# Patient Record
Sex: Female | Born: 1945
Health system: Southern US, Community
[De-identification: ages and names within clinical notes are randomized; demographics above are authoritative.]

## PROBLEM LIST (undated history)

## (undated) DIAGNOSIS — R87619 Unspecified abnormal cytological findings in specimens from cervix uteri: Secondary | ICD-10-CM

## (undated) DIAGNOSIS — T7840XA Allergy, unspecified, initial encounter: Secondary | ICD-10-CM

## (undated) DIAGNOSIS — E079 Disorder of thyroid, unspecified: Secondary | ICD-10-CM

## (undated) DIAGNOSIS — E785 Hyperlipidemia, unspecified: Secondary | ICD-10-CM

## (undated) DIAGNOSIS — M8580 Other specified disorders of bone density and structure, unspecified site: Secondary | ICD-10-CM

## (undated) DIAGNOSIS — K219 Gastro-esophageal reflux disease without esophagitis: Secondary | ICD-10-CM

## (undated) DIAGNOSIS — M1990 Unspecified osteoarthritis, unspecified site: Secondary | ICD-10-CM

## (undated) HISTORY — PX: OTHER SURGICAL HISTORY: SHX169

## (undated) HISTORY — DX: Unspecified osteoarthritis, unspecified site: M19.90

## (undated) HISTORY — PX: UPPER GASTROINTESTINAL ENDOSCOPY: SHX188

## (undated) HISTORY — DX: Other specified disorders of bone density and structure, unspecified site: M85.80

## (undated) HISTORY — DX: Allergy, unspecified, initial encounter: T78.40XA

## (undated) HISTORY — PX: POLYPECTOMY: SHX149

## (undated) HISTORY — DX: Unspecified abnormal cytological findings in specimens from cervix uteri: R87.619

## (undated) HISTORY — DX: Hyperlipidemia, unspecified: E78.5

## (undated) HISTORY — DX: Gastro-esophageal reflux disease without esophagitis: K21.9

## (undated) HISTORY — PX: TUBAL LIGATION: SHX77

## (undated) HISTORY — DX: Disorder of thyroid, unspecified: E07.9

---

## 1999-01-31 ENCOUNTER — Other Ambulatory Visit: Admission: RE | Admit: 1999-01-31 | Discharge: 1999-01-31 | Payer: Self-pay | Admitting: Obstetrics and Gynecology

## 1999-01-31 ENCOUNTER — Encounter: Payer: Self-pay | Admitting: Internal Medicine

## 1999-01-31 ENCOUNTER — Ambulatory Visit (HOSPITAL_COMMUNITY): Admission: RE | Admit: 1999-01-31 | Discharge: 1999-01-31 | Payer: Self-pay | Admitting: Internal Medicine

## 2000-03-01 ENCOUNTER — Ambulatory Visit (HOSPITAL_COMMUNITY): Admission: RE | Admit: 2000-03-01 | Discharge: 2000-03-01 | Payer: Self-pay | Admitting: Pediatrics

## 2000-03-01 ENCOUNTER — Encounter: Payer: Self-pay | Admitting: Obstetrics and Gynecology

## 2000-03-01 ENCOUNTER — Other Ambulatory Visit: Admission: RE | Admit: 2000-03-01 | Discharge: 2000-03-01 | Payer: Self-pay | Admitting: Obstetrics and Gynecology

## 2001-01-31 HISTORY — PX: SHOULDER ADHESION RELEASE: SHX773

## 2001-02-11 ENCOUNTER — Ambulatory Visit (HOSPITAL_BASED_OUTPATIENT_CLINIC_OR_DEPARTMENT_OTHER): Admission: RE | Admit: 2001-02-11 | Discharge: 2001-02-11 | Payer: Self-pay | Admitting: Orthopaedic Surgery

## 2001-09-17 ENCOUNTER — Other Ambulatory Visit: Admission: RE | Admit: 2001-09-17 | Discharge: 2001-09-17 | Payer: Self-pay | Admitting: Obstetrics and Gynecology

## 2001-09-17 ENCOUNTER — Encounter: Payer: Self-pay | Admitting: Obstetrics and Gynecology

## 2001-09-17 ENCOUNTER — Ambulatory Visit (HOSPITAL_COMMUNITY): Admission: RE | Admit: 2001-09-17 | Discharge: 2001-09-17 | Payer: Self-pay | Admitting: Obstetrics and Gynecology

## 2002-09-29 ENCOUNTER — Encounter: Payer: Self-pay | Admitting: Obstetrics and Gynecology

## 2002-09-29 ENCOUNTER — Ambulatory Visit (HOSPITAL_COMMUNITY): Admission: RE | Admit: 2002-09-29 | Discharge: 2002-09-29 | Payer: Self-pay | Admitting: Obstetrics and Gynecology

## 2002-09-29 ENCOUNTER — Other Ambulatory Visit: Admission: RE | Admit: 2002-09-29 | Discharge: 2002-09-29 | Payer: Self-pay | Admitting: Obstetrics and Gynecology

## 2003-10-27 ENCOUNTER — Other Ambulatory Visit: Admission: RE | Admit: 2003-10-27 | Discharge: 2003-10-27 | Payer: Self-pay | Admitting: Obstetrics and Gynecology

## 2003-10-27 ENCOUNTER — Ambulatory Visit (HOSPITAL_COMMUNITY): Admission: RE | Admit: 2003-10-27 | Discharge: 2003-10-27 | Payer: Self-pay | Admitting: Obstetrics and Gynecology

## 2003-10-29 ENCOUNTER — Encounter: Admission: RE | Admit: 2003-10-29 | Discharge: 2003-10-29 | Payer: Self-pay | Admitting: Obstetrics and Gynecology

## 2004-11-08 ENCOUNTER — Encounter: Admission: RE | Admit: 2004-11-08 | Discharge: 2004-11-08 | Payer: Self-pay | Admitting: Obstetrics and Gynecology

## 2004-11-14 ENCOUNTER — Encounter: Admission: RE | Admit: 2004-11-14 | Discharge: 2004-11-14 | Payer: Self-pay | Admitting: Obstetrics and Gynecology

## 2004-12-20 ENCOUNTER — Other Ambulatory Visit: Admission: RE | Admit: 2004-12-20 | Discharge: 2004-12-20 | Payer: Self-pay | Admitting: Obstetrics and Gynecology

## 2005-12-12 ENCOUNTER — Encounter: Admission: RE | Admit: 2005-12-12 | Discharge: 2005-12-12 | Payer: Self-pay | Admitting: Obstetrics and Gynecology

## 2006-02-12 ENCOUNTER — Other Ambulatory Visit: Admission: RE | Admit: 2006-02-12 | Discharge: 2006-02-12 | Payer: Self-pay | Admitting: Obstetrics and Gynecology

## 2007-02-11 ENCOUNTER — Ambulatory Visit (HOSPITAL_COMMUNITY): Admission: RE | Admit: 2007-02-11 | Discharge: 2007-02-11 | Payer: Self-pay | Admitting: Obstetrics and Gynecology

## 2007-03-12 ENCOUNTER — Other Ambulatory Visit: Admission: RE | Admit: 2007-03-12 | Discharge: 2007-03-12 | Payer: Self-pay | Admitting: Obstetrics and Gynecology

## 2008-03-11 ENCOUNTER — Ambulatory Visit (HOSPITAL_COMMUNITY): Admission: RE | Admit: 2008-03-11 | Discharge: 2008-03-11 | Payer: Self-pay | Admitting: Obstetrics and Gynecology

## 2008-03-19 ENCOUNTER — Other Ambulatory Visit: Admission: RE | Admit: 2008-03-19 | Discharge: 2008-03-19 | Payer: Self-pay | Admitting: Obstetrics and Gynecology

## 2008-04-15 ENCOUNTER — Ambulatory Visit (HOSPITAL_COMMUNITY): Admission: RE | Admit: 2008-04-15 | Discharge: 2008-04-15 | Payer: Self-pay | Admitting: Obstetrics and Gynecology

## 2009-04-11 ENCOUNTER — Ambulatory Visit (HOSPITAL_COMMUNITY): Admission: RE | Admit: 2009-04-11 | Discharge: 2009-04-11 | Payer: Self-pay | Admitting: Obstetrics and Gynecology

## 2010-06-01 ENCOUNTER — Other Ambulatory Visit: Payer: Self-pay | Admitting: Obstetrics and Gynecology

## 2010-06-01 ENCOUNTER — Encounter (INDEPENDENT_AMBULATORY_CARE_PROVIDER_SITE_OTHER): Payer: Self-pay | Admitting: *Deleted

## 2010-06-01 DIAGNOSIS — Z1231 Encounter for screening mammogram for malignant neoplasm of breast: Secondary | ICD-10-CM

## 2010-06-02 ENCOUNTER — Ambulatory Visit (HOSPITAL_COMMUNITY)
Admission: RE | Admit: 2010-06-02 | Discharge: 2010-06-02 | Disposition: A | Discharge status: Home / Self Care | Payer: Medicare Other | Source: Ambulatory Visit | Attending: Obstetrics and Gynecology | Admitting: Obstetrics and Gynecology

## 2010-06-02 DIAGNOSIS — Z1231 Encounter for screening mammogram for malignant neoplasm of breast: Secondary | ICD-10-CM

## 2010-06-08 NOTE — Letter (Signed)
Summary: Pre Visit Letter Revised  [facility]  [address] [place], [place] [address]   Phone: [phone]  Fax: [phone]        06/01/2010 MRN: [id number] Anne Morris [address] [place], [place]  [address]             Procedure Date:  07-13-10           Direct Colon---Dr. Russella Dar   Welcome to the Gastroenterology Division at [facility].    You are scheduled to see a nurse for your pre-procedure visit on 06-29-10 at 10:30a.m. on the 3rd floor at [facility], [address]  We ask that you try to arrive at our office 15 minutes prior to your appointment time to allow for check-in.  Please take a minute to review the attached form.  If you answer "Yes" to one or more of the questions on the first page, we ask that you call the person listed at your earliest opportunity.  If you answer "No" to all of the questions, please complete the rest of the form and bring it to your appointment.    Your nurse visit will consist of discussing your medical and surgical history, your immediate family medical history, and your medications.   If you are unable to list all of your medications on the form, please bring the medication bottles to your appointment and we will list them.  We will need to be aware of both prescribed and over the counter drugs.  We will need to know exact dosage information as well.    Please be prepared to read and sign documents such as consent forms, a financial agreement, and acknowledgement forms.  If necessary, and with your consent, a friend or relative is welcome to sit-in on the nurse visit with you.  Please bring your insurance card so that we may make a copy of it.  If your insurance requires a referral to see a specialist, please bring your referral form from your primary care physician.  No co-pay is required for this nurse visit.     If you cannot keep your appointment, please call [phone] to cancel or reschedule prior  to your appointment date.  This allows [place] the opportunity to schedule an appointment for another patient in need of care.    Thank you for choosing [facility] for your medical needs.  We appreciate the opportunity to care for you.  Please visit [place] at our website  to learn more about our practice.  Sincerely, The Gastroenterology Division

## 2010-06-29 ENCOUNTER — Ambulatory Visit (AMBULATORY_SURGERY_CENTER): Payer: Medicare Other | Admitting: *Deleted

## 2010-06-29 VITALS — Ht 61.5 in | Wt 130.0 lb

## 2010-06-29 DIAGNOSIS — Z86010 Personal history of colonic polyps: Secondary | ICD-10-CM

## 2010-06-29 MED ORDER — PEG-KCL-NACL-NASULF-NA ASC-C 100 G PO SOLR: 1.0000 | Freq: Once | ORAL | Status: AC

## 2010-07-12 ENCOUNTER — Encounter: Payer: Self-pay | Admitting: Gastroenterology

## 2010-07-13 ENCOUNTER — Encounter: Payer: Self-pay | Admitting: Gastroenterology

## 2010-07-13 ENCOUNTER — Ambulatory Visit (AMBULATORY_SURGERY_CENTER): Payer: Medicare Other | Admitting: Gastroenterology

## 2010-07-13 VITALS — BP 146/79 | HR 63 | Temp 97.7°F | Resp 16 | Ht 61.5 in | Wt 128.0 lb

## 2010-07-13 DIAGNOSIS — K5730 Diverticulosis of large intestine without perforation or abscess without bleeding: Secondary | ICD-10-CM

## 2010-07-13 DIAGNOSIS — Z860100 Personal history of colon polyps, unspecified: Secondary | ICD-10-CM | POA: Insufficient documentation

## 2010-07-13 DIAGNOSIS — Z1211 Encounter for screening for malignant neoplasm of colon: Secondary | ICD-10-CM

## 2010-07-13 HISTORY — PX: COLONOSCOPY: SHX174

## 2010-07-13 MED ORDER — SODIUM CHLORIDE 0.9 % IV SOLN: 500.0000 mL | INTRAVENOUS | Status: DC

## 2010-07-13 NOTE — Patient Instructions (Signed)
Discharge instructions reviewed  Diverticulosis and high fiber diet information given  Repeat colonoscopy in 10 years

## 2010-07-14 ENCOUNTER — Telehealth: Payer: Self-pay | Admitting: *Deleted

## 2010-07-14 NOTE — Telephone Encounter (Signed)

## 2010-08-18 NOTE — Op Note (Signed)
[place]. [facility]  Patient:    Anne Morris, Anne Morris Visit Number: [id number] MRN: [id number]          Service Type: DSU Location: [facility] Attending Physician:  Marcene Corning Dictated by:   Lubertha Basque. Jerl Santos, M.D. Proc. Date: 02/11/01 Admit Date:  02/11/2001                             Operative Report  PREOPERATIVE DIAGNOSES: 1. Left shoulder adhesive capsulitis. 2. Left shoulder impingement.  POSTOPERATIVE DIAGNOSES: 1. Left shoulder adhesive capsulitis. 2. Left shoulder impingement.  PROCEDURE: 1. Left shoulder manipulation and excision scar tissue. 2. Left shoulder acromioplasty. 3. Left shoulder distal clavicle excision.  ANESTHESIA:  General.  SURGEON:  Lubertha Basque. Jerl Santos, M.D.  ASSISTANT:  Prince Rome, P.A.  INDICATIONS FOR PROCEDURE:  The patient is a 65 year old woman with about six months of left shoulder difficulty.  This has persisted despite several intra-articular injections, and aggressive physical therapy.  She has undergone a MRI arthrogram which is consistent with an adhesive capsulitis situation with a small capsule, but also consistent with impingement with thinning of the rotator cuff, and a downsloping acromion.  At this point, she is offered operative intervention to consist of an arthroscopy.  The procedure was discussed with the patient, and informed operative consent was obtained after discussion of possible complications of reaction to anesthesia and infection.  DESCRIPTION OF PROCEDURE:  The patient was taken to the operating suite where general anesthetic was applied without difficulty.  She was positioned in beach chair position and prepped and draped in a normal sterile fashion. After the administration of preoperative IV antibiotics, and arthroscopy of the left shoulder was performed through a total of three portals. Glenohumeral joint showed no degenerative change, while the subscapularis was not visible due  to a thick band of scar tissue across the anterior capsule. This was excised with a shaver and a Bovie.  The biceps tendon was intact, and the rotator cuff appeared benign from below.  A manipulation was performed on the shoulder which took external rotation from 0 to 50 degrees, and forward flexion from 90 to 150.  The scope was reintroduced, and some scar tissue was excised.  The scope was then placed in the subacromial space.  An acromioplasty was performed with the burr in the lateral position, followed by a transfer of the burr to the posterior position.  There also appeared to be some impingement from the under surface of the [facility] joint, and this was co-planed, taking the distal clavicle, but not the [facility] joint formally.  The rotator cuff was examined from above, and some bursal tissue was removed, but no full thickness tear was seen.  The shoulder was thoroughly irrigated, followed by placement of Marcaine with epinephrine and morphine.  Simple sutures of nylon were used to reapproximate the portals, followed by Adaptic and dry gauze with tape.  Estimated blood loss and intraoperative fluids can be obtained from anesthesia records.  DISPOSITION:  The patient was extubated in the operating room and taken to the recovery room in stable condition.  Plans were for her to go home on the same day, and follow up in the office in less than a week.  She will resume therapy tomorrow. Dictated by:   Lubertha Basque Jerl Santos, M.D. Attending Physician:  Marcene Corning DD:  02/11/01 TD:  02/11/01 Job: [id number]

## 2010-08-18 NOTE — Op Note (Signed)
[place]. [facility]  Patient:    Anne Morris, Anne Morris Visit Number: [id number] MRN: [id number]          Service Type: DSU Location: [facility] Attending Physician:  Marcene Corning Dictated by:   Lubertha Basque. Jerl Santos, M.D. Proc. Date: 02/11/01 Admit Date:  02/11/2001                             Operative Report  PREOPERATIVE DIAGNOSIS: 1. Left shoulder adhesive capsulitis. 2. Left shoulder impingement.  POSTOPERATIVE DIAGNOSIS: 1. Left shoulder adhesive capsulitis. 2. Left shoulder impingement.  OPERATION PERFORMED: 1. Left shoulder manipulation and excision of scar tissue. 2. Left shoulder acromioplasty. 3. Left shoulder distal clavicle excision.  ANESTHESIA:  General.  ATTENDING SURGEON:  Lubertha Basque. Jerl Santos, M.D.  ASSISTANT:  Lindwood Qua, P.A.  INDICATIONS FOR PROCEDURE:  The patient is a 65 year old woman with about six months of left shoulder difficulty.  This has persisted despite several intra-articular injections and aggressive physical therapy.  The patient has undergone an MRI arthrogram which was consistent with an adhesive capsulitis situation with a small capsule but also consistent with impingement with thinning of the rotator cuff and a down-sloping acromion.  At this point she is offered operative intervention to consist of an arthroscopy.  The procedure was discussed with the patient and informed operative consent was obtained after discussion of possible complications of reaction to anesthesia and infection.  DESCRIPTION OF PROCEDURE:  The patient was taken to an operating suite where general anesthetic was applied without difficulty.  She was then positioned in beach chair position and prepped and draped in normal sterile fashion.  After administration of preop intravenous antibiotics, an arthroscopy of the left shoulder was performed through a total of three portals.  Glenohumeral joint showed no degenerative change while the  subscapularis was not visible due to a thick band of scar tissue across the anterior capsule.  This was excised with a shaver and a Bovie.  The biceps tendon was intact and the rotator cuff appeared benign from below.  Some manipulation was performed in the shoulder which took external rotation from 0 to 50 degrees and forward flexion from 90 to 150.  The scope was reintroduced and some scar tissue was excised.  The scope was then placed in the subacromial space.  An acromioplasty was performed with the bur in the lateral position followed by transfer of the bur to the posterior position.  There also appeared to be be some impingement from the undersurface of the acromioclavicular joint and this was co-planed taking the distal clavicle but not the acromioclavicular joint formally.  The rotator cuff was examined from above and some bursal tissue was removed but no full thickness tear was seen.  The shoulder was thoroughly irrigated followed by placement of Marcaine with epinephrine and morphine.  Simple sutures of nylon were used to reapproximate the portals followed by Adaptic and dry gauze with tape.  Estimated blood loss and intraoperative fluids can be obtained from Anesthesia records.  DISPOSITION:  The patient was extubated in the operating room and taken to the recovery room in stable condition.  Plans were for her to go home the same day and to follow up in the office in less than a week.  She will resume therapy tomorrow. Dictated by:   Lubertha Basque Jerl Santos, M.D. Attending Physician:  Marcene Corning DD:  02/11/01 TD:  02/11/01 Job: [id number]

## 2011-07-03 ENCOUNTER — Other Ambulatory Visit: Payer: Self-pay | Admitting: Obstetrics and Gynecology

## 2011-07-03 DIAGNOSIS — Z1231 Encounter for screening mammogram for malignant neoplasm of breast: Secondary | ICD-10-CM

## 2011-07-04 ENCOUNTER — Ambulatory Visit (HOSPITAL_COMMUNITY)
Admission: RE | Admit: 2011-07-04 | Discharge: 2011-07-04 | Disposition: A | Discharge status: Home / Self Care | Payer: Medicare Other | Source: Ambulatory Visit | Attending: Obstetrics and Gynecology | Admitting: Obstetrics and Gynecology

## 2011-07-04 DIAGNOSIS — Z1231 Encounter for screening mammogram for malignant neoplasm of breast: Secondary | ICD-10-CM

## 2012-06-11 ENCOUNTER — Other Ambulatory Visit: Payer: Self-pay | Admitting: Obstetrics and Gynecology

## 2012-06-11 DIAGNOSIS — Z1231 Encounter for screening mammogram for malignant neoplasm of breast: Secondary | ICD-10-CM

## 2012-07-04 ENCOUNTER — Ambulatory Visit (HOSPITAL_COMMUNITY): Payer: Medicare Other

## 2012-07-07 ENCOUNTER — Ambulatory Visit (HOSPITAL_COMMUNITY)
Admission: RE | Admit: 2012-07-07 | Discharge: 2012-07-07 | Disposition: A | Discharge status: Home / Self Care | Payer: Medicare Other | Source: Ambulatory Visit | Attending: Obstetrics and Gynecology | Admitting: Obstetrics and Gynecology

## 2012-07-07 DIAGNOSIS — Z1231 Encounter for screening mammogram for malignant neoplasm of breast: Secondary | ICD-10-CM

## 2013-01-21 ENCOUNTER — Telehealth: Payer: Self-pay | Admitting: Family Medicine

## 2013-01-26 ENCOUNTER — Other Ambulatory Visit: Payer: Self-pay

## 2013-01-26 NOTE — Telephone Encounter (Signed)
Last thyroid level 06/03/12  Last seen 06/03/12  [facility]    If approved print for mail order and route to nurse

## 2013-01-27 MED ORDER — LEVOTHYROXINE SODIUM 50 MCG PO TABS: 50.0000 ug | ORAL_TABLET | Freq: Every day | ORAL | Status: DC

## 2013-01-27 NOTE — Telephone Encounter (Signed)
Patient needs to be seen. Has exceeded time since last visit. Limited quantity refilled. Needs to bring all medications to next appointment.   

## 2013-01-27 NOTE — Telephone Encounter (Signed)
Left message for pt that rx for levothyroxine is ready for pick up at front desk.

## 2013-05-04 ENCOUNTER — Encounter (INDEPENDENT_AMBULATORY_CARE_PROVIDER_SITE_OTHER): Payer: Self-pay

## 2013-05-04 ENCOUNTER — Ambulatory Visit (INDEPENDENT_AMBULATORY_CARE_PROVIDER_SITE_OTHER): Payer: Medicare HMO | Admitting: Family Medicine

## 2013-05-04 ENCOUNTER — Encounter: Payer: Self-pay | Admitting: Family Medicine

## 2013-05-04 VITALS — BP 115/60 | HR 60 | Temp 97.8°F | Ht 61.5 in | Wt 145.0 lb

## 2013-05-04 DIAGNOSIS — E782 Mixed hyperlipidemia: Secondary | ICD-10-CM | POA: Insufficient documentation

## 2013-05-04 DIAGNOSIS — E039 Hypothyroidism, unspecified: Secondary | ICD-10-CM | POA: Insufficient documentation

## 2013-05-04 DIAGNOSIS — E785 Hyperlipidemia, unspecified: Secondary | ICD-10-CM

## 2013-05-04 DIAGNOSIS — Z23 Encounter for immunization: Secondary | ICD-10-CM

## 2013-05-04 DIAGNOSIS — E559 Vitamin D deficiency, unspecified: Secondary | ICD-10-CM | POA: Insufficient documentation

## 2013-05-04 DIAGNOSIS — Z0000 Encounter for general adult medical examination without abnormal findings: Secondary | ICD-10-CM

## 2013-05-04 LAB — POCT CBC
GRANULOCYTE PERCENT: 69.4 % (ref 37–80)
HCT, POC: 43.3 % (ref 37.7–47.9)
Hemoglobin: 14 g/dL (ref 12.2–16.2)
LYMPH, POC: 2.6 (ref 0.6–3.4)
MCH, POC: 28.2 pg (ref 27–31.2)
MCHC: 32.2 g/dL (ref 31.8–35.4)
MCV: 87.6 fL (ref 80–97)
MPV: 9.2 fL (ref 0–99.8)
PLATELET COUNT, POC: 260 10*3/uL (ref 142–424)
POC Granulocyte: 6.5 (ref 2–6.9)
POC LYMPH PERCENT: 27.8 %L (ref 10–50)
RBC: 5 M/uL (ref 4.04–5.48)
RDW, POC: 13.5 %
WBC: 9.3 10*3/uL (ref 4.6–10.2)

## 2013-05-04 NOTE — Progress Notes (Addendum)
Subjective:    Patient ID: Anne Morris, female    DOB: 20-Sep-1945, 68 y.o.   MRN: [id number]  HPI Pt here for follow up and management of chronic medical problems. Patient comes in today for her yearly exam. She also goes to see the gynecologist. She will be given FOBT, have lab work drawn, and we'll consider getting a Prevnar vaccine and 18. We are currently checking on the date of her last DEXA scan. She will check with her insurance regarding the administration of a Zostavax vaccine.        Patient Active Problem List   Diagnosis Date Noted  . Hypothyroidism 05/04/2013  . Hyperlipidemia 05/04/2013  . Personal  07/13/2010  . Special screening for malignant neoplasms, colon 07/13/2010   Outpatient Encounter Prescriptions as of 05/04/2013  Medication Sig  . Calcium-Vitamin D (CALTRATE 600 PLUS-VIT D PO) Take by mouth.    . cholecalciferol (VITAMIN D) 1000 UNITS tablet Take 2,000 Units by mouth daily.  . Coenzyme Q10 200 MG capsule Take 200 mg by mouth daily.  Marland Kitchen levothyroxine (SYNTHROID, LEVOTHROID) 50 MCG tablet Take 1 tablet (50 mcg total) by mouth daily.  . [DISCONTINUED] fish oil-omega-3 fatty acids 1000 MG capsule Take 1,200 g by mouth daily.    . [DISCONTINUED] fluticasone (FLONASE) 50 MCG/ACT nasal spray 1 spray by Nasal route daily.    . [DISCONTINUED] Vitamin D, Ergocalciferol, (DRISDOL) 50000 UNITS CAPS Take 50,000 Units by mouth every 7 (seven) days.      Review of Systems  Constitutional: Negative.   HENT: Negative.   Eyes: Negative.   Respiratory: Negative.   Cardiovascular: Negative.   Gastrointestinal: Negative.   Endocrine: Negative.   Genitourinary: Negative.   Musculoskeletal: Negative.   Skin: Negative.   Allergic/Immunologic: Negative.   Neurological: Negative.   Hematological: Negative.   Psychiatric/Behavioral: Negative.        Objective:   Physical Exam  Nursing note and vitals reviewed. Constitutional: She is oriented to person, place, and  time. She appears well-developed and well-nourished. No distress.  Pleasant and younger appearing than her stated age  HENT:  Head: Normocephalic and atraumatic.  Right Ear: External ear normal.  Left Ear: External ear normal.  Nose: Nose normal.  Mouth/Throat: Oropharynx is clear and moist.  Eyes: Conjunctivae and EOM are normal. Pupils are equal, round, and reactive to light. Right eye exhibits no discharge. Left eye exhibits no discharge. No scleral icterus.  Neck: Normal range of motion. Neck supple. No JVD present. No thyromegaly present.  No carotid bruits  Cardiovascular: Normal rate, regular rhythm, normal heart sounds and intact distal pulses.  Exam reveals no gallop and no friction rub.   No murmur heard. At 72 per minute  Pulmonary/Chest: Effort normal and breath sounds normal. No respiratory distress. She has no wheezes. She has no rales. She exhibits no tenderness.  No axillary nodes  Abdominal: Soft. Bowel sounds are normal. She exhibits no mass. There is no tenderness. There is no rebound and no guarding.  Musculoskeletal: Normal range of motion. She exhibits no edema and no tenderness.  Lymphadenopathy:    She has no cervical adenopathy.  Neurological: She is alert and oriented to person, place, and time. She has normal reflexes. No cranial nerve deficit.  Skin: Skin is warm and dry. No rash noted.  Psychiatric: She has a normal mood and affect. Her behavior is normal. Judgment and thought content normal.   BP 115/60  Pulse 60  Temp(Src) 97.8 F (36.6 C) (  Oral)  Ht 5' 1.5" (1.562 m)  Wt 145 lb (65.772 kg)  BMI 26.96 kg/m2  [facility] reading (PRIMARY) by  Dr. Brunilda Payor x-ray-patient did not get chest x-ray today                                         Assessment & Plan:      1. Hypothyroidism - POCT CBC - Thyroid Panel With TSH  2. Hyperlipidemia - POCT CBC - BMP8+EGFR - Hepatic function panel - NMR, lipoprofile  3. Vitamin D deficiency - Vit D  25  hydroxy (rtn osteoporosis monitoring)  4. Health care maintenance  Meds ordered this encounter  Medications  . cholecalciferol (VITAMIN D) 1000 UNITS tablet    Sig: Take 2,000 Units by mouth daily.  . Coenzyme Q10 200 MG capsule    Sig: Take 200 mg by mouth daily.     Patient Instructions  Continue current medications. Continue good therapeutic lifestyle changes which include good diet and exercise. Fall precautions discussed with patient. Schedule your flu vaccine if you haven't had it yet If you are over 36 years old - you may need Prevnar 72 or the adult Pneumonia vaccine. We will call you with the results of the lab work once those results are available. Please sign up for my chart Return the FOBT You are due a Prevnar, a Zostavax and T-dap   Arrie Senate MD

## 2013-05-04 NOTE — Patient Instructions (Addendum)
Continue current medications. Continue good therapeutic lifestyle changes which include good diet and exercise. Fall precautions discussed with patient. Schedule your flu vaccine if you haven't had it yet If you are over 68 years old - you may need Prevnar 73 or the adult Pneumonia vaccine. We will call you with the results of the lab work once those results are available. Please sign up for my chart Return the FOBT You are due a Prevnar, a Zostavax and T-dap

## 2013-05-04 NOTE — Addendum Note (Signed)
Addended by: Zannie Cove on: 05/04/2013 12:24 PM   Modules accepted: Orders

## 2013-05-06 LAB — NMR, LIPOPROFILE
Cholesterol: 191 mg/dL (ref <200)
HDL CHOLESTEROL BY NMR: 56 mg/dL (ref >=40)
HDL PARTICLE NUMBER: 34.3 umol/L (ref >=30.5)
LDL Particle Number: 1630 nmol/L — ABNORMAL HIGH (ref <1000)
LDL Size: 21.2 nm (ref >20.5)
LDLC SERPL CALC-MCNC: 111 mg/dL — ABNORMAL HIGH (ref <100)
LP-IR SCORE: 45 (ref <=45)
Small LDL Particle Number: 398 nmol/L (ref <=527)
Triglycerides by NMR: 119 mg/dL (ref <150)

## 2013-05-06 LAB — HEPATIC FUNCTION PANEL
ALK PHOS: 111 IU/L (ref 39–117)
ALT: 31 IU/L (ref 0–32)
AST: 30 IU/L (ref 0–40)
Albumin: 4.3 g/dL (ref 3.6–4.8)
Bilirubin, Direct: 0.11 mg/dL (ref 0.00–0.40)
Total Bilirubin: 0.5 mg/dL (ref 0.0–1.2)
Total Protein: 6.2 g/dL (ref 6.0–8.5)

## 2013-05-06 LAB — VITAMIN D 25 HYDROXY (VIT D DEFICIENCY, FRACTURES): VIT D 25 HYDROXY: 30.9 ng/mL (ref 30.0–100.0)

## 2013-05-06 LAB — BMP8+EGFR
BUN / CREAT RATIO: 17 (ref 11–26)
BUN: 15 mg/dL (ref 8–27)
CHLORIDE: 104 mmol/L (ref 97–108)
CO2: 24 mmol/L (ref 18–29)
Calcium: 9.4 mg/dL (ref 8.7–10.3)
Creatinine, Ser: 0.86 mg/dL (ref 0.57–1.00)
GFR calc non Af Amer: 70 mL/min/{1.73_m2} (ref >59)
GFR, EST AFRICAN AMERICAN: 80 mL/min/{1.73_m2} (ref >59)
Glucose: 94 mg/dL (ref 65–99)
Potassium: 5.3 mmol/L — ABNORMAL HIGH (ref 3.5–5.2)
SODIUM: 142 mmol/L (ref 134–144)

## 2013-05-06 LAB — THYROID PANEL WITH TSH
FREE THYROXINE INDEX: 2.5 (ref 1.2–4.9)
T3 Uptake Ratio: 27 % (ref 24–39)
T4, Total: 9.1 ug/dL (ref 4.5–12.0)
TSH: 1.43 u[IU]/mL (ref 0.450–4.500)

## 2013-05-07 ENCOUNTER — Encounter: Payer: Self-pay | Admitting: Family Medicine

## 2013-05-07 ENCOUNTER — Telehealth: Payer: Self-pay | Admitting: *Deleted

## 2013-05-07 NOTE — Telephone Encounter (Signed)
Aware of results. She will return in 2 weeks to recheck potassium. She is aware of need to schedule with pharmacist to discuss diet.

## 2013-05-07 NOTE — Telephone Encounter (Signed)
Message copied by Shelbie Ammons on Thu May 07, 2013  2:58 PM ------      Message from: Chipper Herb      Created: Wed May 06, 2013  7:33 AM       Blood sugar, kidney function test and electrolytes are within normal limits except the potassium is slightly elevated.------- repeat potassium or BMP in 2 weeks nonfasting      Liver function tests are within normal limits      The total LDL particle number is elevated at 1630 this should be less than 1000. This is by advanced lipid testing. The LDL C. is elevated at 111. The triglycerides are good. The good cholesterol is good.----- please schedule the patient with an appointment with the clinical pharmacist to help her more with a diet. She would prefer not to take any medication.      The vitamin D level is at the low end of the normal range at 30.9. She should increase her vitamin D by 1000 daily she can buy vitamin D3 over-the-counter.      All thyroid function test are within normal limits ------

## 2013-05-11 ENCOUNTER — Other Ambulatory Visit: Payer: Self-pay | Admitting: Family Medicine

## 2013-05-11 DIAGNOSIS — E039 Hypothyroidism, unspecified: Secondary | ICD-10-CM

## 2013-05-11 MED ORDER — LEVOTHYROXINE SODIUM 50 MCG PO TABS: 50.0000 ug | ORAL_TABLET | Freq: Every day | ORAL | Status: DC

## 2013-05-16 ENCOUNTER — Encounter: Payer: Self-pay | Admitting: Family Medicine

## 2013-05-21 ENCOUNTER — Other Ambulatory Visit (INDEPENDENT_AMBULATORY_CARE_PROVIDER_SITE_OTHER): Payer: Medicare HMO

## 2013-05-21 DIAGNOSIS — R7989 Other specified abnormal findings of blood chemistry: Secondary | ICD-10-CM

## 2013-05-22 ENCOUNTER — Encounter: Payer: Self-pay | Admitting: Family Medicine

## 2013-05-22 LAB — BMP8+EGFR
BUN/Creatinine Ratio: 20 (ref 11–26)
BUN: 17 mg/dL (ref 8–27)
CALCIUM: 9.3 mg/dL (ref 8.7–10.3)
CO2: 24 mmol/L (ref 18–29)
CREATININE: 0.86 mg/dL (ref 0.57–1.00)
Chloride: 103 mmol/L (ref 97–108)
GFR calc Af Amer: 80 mL/min/{1.73_m2} (ref >59)
GFR, EST NON AFRICAN AMERICAN: 70 mL/min/{1.73_m2} (ref >59)
GLUCOSE: 96 mg/dL (ref 65–99)
Potassium: 4.6 mmol/L (ref 3.5–5.2)
Sodium: 142 mmol/L (ref 134–144)

## 2013-05-26 MED ORDER — LEVOTHYROXINE SODIUM 50 MCG PO TABS: 50.0000 ug | ORAL_TABLET | Freq: Every day | ORAL | Status: DC

## 2013-06-30 ENCOUNTER — Other Ambulatory Visit: Payer: Medicare HMO

## 2013-06-30 DIAGNOSIS — Z1212 Encounter for screening for malignant neoplasm of rectum: Secondary | ICD-10-CM

## 2013-06-30 NOTE — Progress Notes (Signed)
Patient dropped off fobt 

## 2013-07-01 LAB — FECAL OCCULT BLOOD, IMMUNOCHEMICAL: Fecal Occult Bld: NEGATIVE

## 2013-07-08 NOTE — Progress Notes (Signed)
Quick Note:  Copy of labs sent to patient ______ 

## 2013-12-14 ENCOUNTER — Other Ambulatory Visit: Payer: Self-pay | Admitting: *Deleted

## 2013-12-14 DIAGNOSIS — Z85828 Personal history of other malignant neoplasm of skin: Secondary | ICD-10-CM

## 2014-06-01 LAB — HM MAMMOGRAPHY

## 2014-06-02 ENCOUNTER — Other Ambulatory Visit: Payer: Self-pay | Admitting: Nurse Practitioner

## 2014-06-02 DIAGNOSIS — Z1231 Encounter for screening mammogram for malignant neoplasm of breast: Secondary | ICD-10-CM

## 2014-06-07 ENCOUNTER — Encounter: Payer: Self-pay | Admitting: Family Medicine

## 2014-06-08 ENCOUNTER — Other Ambulatory Visit (INDEPENDENT_AMBULATORY_CARE_PROVIDER_SITE_OTHER): Payer: Medicare HMO

## 2014-06-08 DIAGNOSIS — E785 Hyperlipidemia, unspecified: Secondary | ICD-10-CM

## 2014-06-08 DIAGNOSIS — E039 Hypothyroidism, unspecified: Secondary | ICD-10-CM

## 2014-06-08 DIAGNOSIS — E559 Vitamin D deficiency, unspecified: Secondary | ICD-10-CM | POA: Diagnosis not present

## 2014-06-08 LAB — POCT CBC
Granulocyte percent: 57.7 %G (ref 37–80)
HEMATOCRIT: 44.6 % (ref 37.7–47.9)
HEMOGLOBIN: 14.2 g/dL (ref 12.2–16.2)
Lymph, poc: 2 (ref 0.6–3.4)
MCH: 28 pg (ref 27–31.2)
MCHC: 31.9 g/dL (ref 31.8–35.4)
MCV: 87.7 fL (ref 80–97)
MPV: 9.9 fL (ref 0–99.8)
POC Granulocyte: 2.9 (ref 2–6.9)
POC LYMPH %: 38.5 % (ref 10–50)
Platelet Count, POC: 269 10*3/uL (ref 142–424)
RBC: 5.08 M/uL (ref 4.04–5.48)
RDW, POC: 13.1 %
WBC: 5.1 10*3/uL (ref 4.6–10.2)

## 2014-06-08 NOTE — Progress Notes (Signed)
Lab only 

## 2014-06-09 LAB — HEPATIC FUNCTION PANEL
ALT: 18 IU/L (ref 0–32)
AST: 22 IU/L (ref 0–40)
Albumin: 4.3 g/dL (ref 3.6–4.8)
Alkaline Phosphatase: 102 IU/L (ref 39–117)
BILIRUBIN, DIRECT: 0.1 mg/dL (ref 0.00–0.40)
Bilirubin Total: 0.4 mg/dL (ref 0.0–1.2)
Total Protein: 6.6 g/dL (ref 6.0–8.5)

## 2014-06-09 LAB — BMP8+EGFR
BUN/Creatinine Ratio: 15 (ref 11–26)
BUN: 13 mg/dL (ref 8–27)
CHLORIDE: 105 mmol/L (ref 97–108)
CO2: 20 mmol/L (ref 18–29)
Calcium: 9.5 mg/dL (ref 8.7–10.3)
Creatinine, Ser: 0.85 mg/dL (ref 0.57–1.00)
GFR calc Af Amer: 81 mL/min/{1.73_m2} (ref >59)
GFR, EST NON AFRICAN AMERICAN: 70 mL/min/{1.73_m2} (ref >59)
Glucose: 93 mg/dL (ref 65–99)
Potassium: 4.7 mmol/L (ref 3.5–5.2)
SODIUM: 143 mmol/L (ref 134–144)

## 2014-06-09 LAB — NMR, LIPOPROFILE
Cholesterol: 178 mg/dL (ref 100–199)
HDL Cholesterol by NMR: 52 mg/dL (ref >39)
HDL PARTICLE NUMBER: 31.5 umol/L (ref >=30.5)
LDL Particle Number: 1340 nmol/L — ABNORMAL HIGH (ref <1000)
LDL SIZE: 21.1 nm (ref >20.5)
LDL-C: 103 mg/dL — ABNORMAL HIGH (ref 0–99)
LP-IR Score: 44 (ref <=45)
Small LDL Particle Number: 552 nmol/L — ABNORMAL HIGH (ref <=527)
Triglycerides by NMR: 114 mg/dL (ref 0–149)

## 2014-06-09 LAB — THYROID PANEL WITH TSH
Free Thyroxine Index: 2.9 (ref 1.2–4.9)
T3 Uptake Ratio: 26 % (ref 24–39)
T4, Total: 11 ug/dL (ref 4.5–12.0)
TSH: 1.34 u[IU]/mL (ref 0.450–4.500)

## 2014-06-09 LAB — VITAMIN D 25 HYDROXY (VIT D DEFICIENCY, FRACTURES): Vit D, 25-Hydroxy: 31.2 ng/mL (ref 30.0–100.0)

## 2014-06-14 ENCOUNTER — Encounter: Payer: Self-pay | Admitting: Family Medicine

## 2014-06-14 ENCOUNTER — Ambulatory Visit (INDEPENDENT_AMBULATORY_CARE_PROVIDER_SITE_OTHER): Payer: Medicare HMO | Admitting: Family Medicine

## 2014-06-14 ENCOUNTER — Ambulatory Visit (INDEPENDENT_AMBULATORY_CARE_PROVIDER_SITE_OTHER): Payer: Medicare HMO

## 2014-06-14 VITALS — BP 137/65 | HR 67 | Temp 97.3°F | Ht 61.5 in | Wt 145.0 lb

## 2014-06-14 DIAGNOSIS — R300 Dysuria: Secondary | ICD-10-CM | POA: Diagnosis not present

## 2014-06-14 DIAGNOSIS — E785 Hyperlipidemia, unspecified: Secondary | ICD-10-CM

## 2014-06-14 DIAGNOSIS — E039 Hypothyroidism, unspecified: Secondary | ICD-10-CM

## 2014-06-14 DIAGNOSIS — E559 Vitamin D deficiency, unspecified: Secondary | ICD-10-CM

## 2014-06-14 DIAGNOSIS — Z0000 Encounter for general adult medical examination without abnormal findings: Secondary | ICD-10-CM

## 2014-06-14 LAB — POCT URINALYSIS DIPSTICK
Bilirubin, UA: NEGATIVE
Glucose, UA: NEGATIVE
Ketones, UA: NEGATIVE
Nitrite, UA: NEGATIVE
UROBILINOGEN UA: NEGATIVE
pH, UA: 5

## 2014-06-14 LAB — POCT UA - MICROSCOPIC ONLY
Casts, Ur, LPF, POC: NEGATIVE
Crystals, Ur, HPF, POC: NEGATIVE
Mucus, UA: NEGATIVE
Yeast, UA: NEGATIVE

## 2014-06-14 MED ORDER — LEVOTHYROXINE SODIUM 50 MCG PO TABS: 50.0000 ug | ORAL_TABLET | Freq: Every day | ORAL | Status: DC

## 2014-06-14 NOTE — Patient Instructions (Addendum)
Medicare Annual Wellness Visit  [place] and the medical providers at [facility] strive to bring you the best medical care.  In doing so we not only want to address your current medical conditions and concerns but also to detect new conditions early and prevent illness, disease and health-related problems.    Medicare offers a yearly Wellness Visit which allows our clinical staff to assess your need for preventative services including immunizations, lifestyle education, counseling to decrease risk of preventable diseases and screening for fall risk and other medical concerns.    This visit is provided free of charge (no copay) for all Medicare recipients. The clinical pharmacists at [facility] have begun to conduct these Wellness Visits which will also include a thorough review of all your medications.    As you primary medical provider recommend that you make an appointment for your Annual Wellness Visit if you have not done so already this year.  You may set up this appointment before you leave today or you may call back ([phone]) and schedule an appointment.  Please make sure when you call that you mention that you are scheduling your Annual Wellness Visit with the clinical pharmacist so that the appointment may be made for the proper length of time.     Continue current medications. Continue good therapeutic lifestyle changes which include good diet and exercise. Fall precautions discussed with patient. If an FOBT was given today- please return it to our front desk. If you are over 6 years old - you may need Prevnar 42 or the adult Pneumonia vaccine.  Flu Shots are still available at our office. If you still haven't had one please call to set up a nurse visit to get one.   After your visit with [place] today you will receive a survey in the mail or online from [employer] regarding your care with [place]. Please take a moment to  fill this out. Your feedback is very important to [place] as you can help [place] better understand your patient needs as well as improve your experience and satisfaction. WE CARE ABOUT YOU!!!   The patient should make a special effort to drink more water She should exercise regularly and watch her diet for carbohydrates Watch the fried greasy and highly spiced foods Have less dairy products like ice cream etc. We will call you with the results of the urinalysis and a chest x-ray if you get one today. Please return the FOBT Please use Debrox for the ears 2-3 drops nightly for 3 nights wait 1 week and repeat this. If the cerumen doesn't seem to soften and come out please call the nurse and she will irrigate this on each side for you. You can purchase the vitamin D3, 1000, the now brand at the drugstore and take one daily indefinitely

## 2014-06-14 NOTE — Addendum Note (Signed)
Addended by: Pollyann Kennedy F on: 06/14/2014 05:29 PM   Modules accepted: Orders

## 2014-06-14 NOTE — Progress Notes (Signed)
Subjective:    Patient ID: Anne Morris, female    DOB: [birth year], 69 y.o.   MRN: [id number]  HPI Pt here for follow up and management of chronic medical problems which includes hypothyroid. She is taking medications regularly. The patient is pleasant and looks younger than her stated age of 69 years. There is a family history of breast cancer she gets her mammogram yearly. The patient denies chest pain, trouble swallowing or any GI symptoms. She also denies any voiding problems. She's been taking her thyroid medicine regularly but has not taken vitamin D in a good while. She has no specific complaints. Her recent lab work was reviewed with her in as a result of this we're going to have her start back on her vitamin D3 1000  1 daily area       Patient Active Problem List   Diagnosis Date Noted  . Hypothyroidism 05/04/2013  . Hyperlipidemia 05/04/2013  . Vitamin D deficiency 05/04/2013  . Personal  07/13/2010  . Special screening for malignant neoplasms, colon 07/13/2010   Outpatient Encounter Prescriptions as of 06/14/2014  Medication Sig  . levothyroxine (SYNTHROID, LEVOTHROID) 50 MCG tablet Take 1 tablet (50 mcg total) by mouth daily.  . [DISCONTINUED] Calcium-Vitamin D (CALTRATE 600 PLUS-VIT D PO) Take by mouth.    . [DISCONTINUED] cholecalciferol (VITAMIN D) 1000 UNITS tablet Take 2,000 Units by mouth daily.  . [DISCONTINUED] Coenzyme Q10 200 MG capsule Take 200 mg by mouth daily.    Review of Systems  Constitutional: Negative.   HENT: Negative.   Eyes: Negative.   Respiratory: Negative.   Cardiovascular: Negative.   Gastrointestinal: Negative.   Endocrine: Negative.   Genitourinary: Negative.   Musculoskeletal: Negative.   Skin: Negative.   Allergic/Immunologic: Negative.   Neurological: Negative.   Hematological: Negative.   Psychiatric/Behavioral: Negative.        Objective:   Physical Exam  Constitutional: She is oriented to person, place, and time. She  appears well-developed and well-nourished. No distress.  A shunt is pleasant and alert.  HENT:  Head: Normocephalic and atraumatic.  Right Ear: External ear normal.  Left Ear: External ear normal.  Nose: Nose normal.  Mouth/Throat: Oropharynx is clear and moist. No oropharyngeal exudate.  Eyes: Conjunctivae and EOM are normal. Pupils are equal, round, and reactive to light. Right eye exhibits no discharge. Left eye exhibits no discharge. No scleral icterus.  Neck: Normal range of motion. Neck supple. No JVD present. No thyromegaly present.  There were no carotid bruits or thyroid enlargement.  Cardiovascular: Normal rate, regular rhythm, normal heart sounds and intact distal pulses.  Exam reveals no gallop and no friction rub.   No murmur heard. The heart had a regular rate and rhythm at 72/m  Pulmonary/Chest: Effort normal and breath sounds normal. No respiratory distress. She has no wheezes. She has no rales. She exhibits no tenderness.  Lungs are clear anteriorly and posteriorly and the axillary regions were without adenopathy on both sides.  Abdominal: Soft. Bowel sounds are normal. She exhibits no mass. There is no tenderness. There is no rebound and no guarding.  There is no abdominal tenderness or inguinal adenopathy  Musculoskeletal: Normal range of motion. She exhibits no edema or tenderness.  Lymphadenopathy:    She has no cervical adenopathy.  Neurological: She is alert and oriented to person, place, and time. She has normal reflexes. No cranial nerve deficit.  Skin: Skin is warm and dry. No rash noted.  Psychiatric: She has  a normal mood and affect. Her behavior is normal. Judgment and thought content normal.  Nursing note and vitals reviewed.  BP 137/65 mmHg  Pulse 67  Temp(Src) 97.3 F (36.3 C) (Oral)  Ht 5' 1.5" (1.562 m)  Wt 145 lb (65.772 kg)  BMI 26.96 kg/m2  [facility] reading (PRIMARY) by  Dr. Brunilda Payor x-ray--  No active disease                                       Assessment & Plan:  1. Hypothyroidism, unspecified hypothyroidism type -Recent lab work was reviewed and patient can continue with her current treatment and a new prescription was called in - DG Chest 2 View; Future  2. Hyperlipidemia -Cholesterol numbers were improved from 1 year ago and the patient does not want to take any medication for this. She is agreed to try to do better with diet and exercise - DG Chest 2 View; Future  3. Vitamin D deficiency -The vitamin D level was at the low end of the normal range. The patient had stopped taking her vitamin D3 at home and she will restart this.  4. Health care maintenance - DG Chest 2 View; Future  Meds ordered this encounter  Medications  . levothyroxine (SYNTHROID, LEVOTHROID) 50 MCG tablet    Sig: Take 1 tablet (50 mcg total) by mouth daily.    Dispense:  30 tablet    Refill:  1   Patient Instructions                       Medicare Annual Wellness Visit  [facility] and the medical providers at [facility] strive to bring you the best medical care.  In doing so we not only want to address your current medical conditions and concerns but also to detect new conditions early and prevent illness, disease and health-related problems.    Medicare offers a yearly Wellness Visit which allows our clinical staff to assess your need for preventative services including immunizations, lifestyle education, counseling to decrease risk of preventable diseases and screening for fall risk and other medical concerns.    This visit is provided free of charge (no copay) for all Medicare recipients. The clinical pharmacists at [facility] have begun to conduct these Wellness Visits which will also include a thorough review of all your medications.    As you primary medical provider recommend that you make an appointment for your Annual Wellness Visit if you have not done so already this year.  You  may set up this appointment before you leave today or you may call back ([phone]) and schedule an appointment.  Please make sure when you call that you mention that you are scheduling your Annual Wellness Visit with the clinical pharmacist so that the appointment may be made for the proper length of time.     Continue current medications. Continue good therapeutic lifestyle changes which include good diet and exercise. Fall precautions discussed with patient. If an FOBT was given today- please return it to our front desk. If you are over 83 years old - you may need Prevnar 67 or the adult Pneumonia vaccine.  Flu Shots are still available at our office. If you still haven't had one please call to set up a nurse visit to get one.   After your visit with [place] today you will  receive a survey in the mail or online from [employer] regarding your care with [place]. Please take a moment to fill this out. Your feedback is very important to [place] as you can help [place] better understand your patient needs as well as improve your experience and satisfaction. WE CARE ABOUT YOU!!!   The patient should make a special effort to drink more water She should exercise regularly and watch her diet for carbohydrates Watch the fried greasy and highly spiced foods Have less dairy products like ice cream etc. We will call you with the results of the urinalysis and a chest x-ray if you get one today. Please return the FOBT Please use Debrox for the ears 2-3 drops nightly for 3 nights wait 1 week and repeat this. If the cerumen doesn't seem to soften and come out please call the nurse and she will irrigate this on each side for you. You can purchase the vitamin D3, 1000, the now brand at the drugstore and take one daily indefinitely   Arrie Senate MD

## 2014-06-16 LAB — URINE CULTURE

## 2014-06-17 ENCOUNTER — Encounter: Payer: Self-pay | Admitting: Nurse Practitioner

## 2014-06-17 ENCOUNTER — Ambulatory Visit (HOSPITAL_COMMUNITY)
Admission: RE | Admit: 2014-06-17 | Discharge: 2014-06-17 | Disposition: A | Discharge status: Home / Self Care | Payer: Medicare HMO | Source: Ambulatory Visit | Attending: Nurse Practitioner | Admitting: Nurse Practitioner

## 2014-06-17 ENCOUNTER — Ambulatory Visit (INDEPENDENT_AMBULATORY_CARE_PROVIDER_SITE_OTHER): Payer: Medicare HMO | Admitting: Nurse Practitioner

## 2014-06-17 VITALS — BP 120/70 | HR 70 | Resp 16 | Ht 61.5 in | Wt 145.0 lb

## 2014-06-17 DIAGNOSIS — Z01419 Encounter for gynecological examination (general) (routine) without abnormal findings: Secondary | ICD-10-CM

## 2014-06-17 DIAGNOSIS — Z1231 Encounter for screening mammogram for malignant neoplasm of breast: Secondary | ICD-10-CM | POA: Diagnosis present

## 2014-06-17 NOTE — Patient Instructions (Signed)

## 2014-06-17 NOTE — Progress Notes (Signed)
69 y.o. G1P1 Married  Caucasian Fe here for annual exam.    LMP 7/8/ 2009:  Postmenopausal Bleeding on HRT   Sexually active: Yes.    The current method of family planning is tubal ligation.    Exercising: Yes.    off & on exercise Smoker:  no  Health Maintenance: Pap: 04-13-09 neg, no pap taken 2012 MMG:  4-17/16 -  done today pending results Colonoscopy:  2011 f/u 10 yrs BMD:   2010 TDaP:  2015 - IFOB given by PCP Labs: PCP Self breast exam: done monthly   reports that she quit smoking about 36 years ago. She has never used smokeless tobacco. She reports that she does not drink alcohol or use illicit drugs.  Past Medical History  Diagnosis Date  . Allergy     seasonal  . Arthritis   . Thyroid disease     hypothyroidism  . Hyperlipidemia   . Osteopenia     Past Surgical History  Procedure Laterality Date  . Shoulder adhesion release Left 01/2001  . Colonoscopy  2011    small polyp follow in 10 years  . Polypectomy    . Tubal ligation    . History of iud use  in her 20's    Current Outpatient Prescriptions  Medication Sig Dispense Refill  . levothyroxine (SYNTHROID, LEVOTHROID) 50 MCG tablet Take 1 tablet (50 mcg total) by mouth daily. 90 tablet 3  . Vitamin D, Cholecalciferol, 1000 UNITS CAPS Take by mouth daily.     No current facility-administered medications for this visit.    Family History  Problem Relation Age of Onset  . Cancer Mother     breast  . Breast cancer Mother   . Emphysema Father     ROS:  Pertinent items are noted in HPI.  Otherwise, a comprehensive ROS was negative.  Exam:   BP 120/70 mmHg  Pulse 70  Resp 16  Ht 5' 1.5" (1.562 m)  Wt 145 lb (65.772 kg)  BMI 26.96 kg/m2  LMP 10/08/2007 Height: 5' 1.5" (156.2 cm) Ht Readings from Last 3 Encounters:  06/17/14 5' 1.5" (1.562 m)  06/14/14 5' 1.5" (1.562 m)  05/04/13 5' 1.5" (1.562 m)    General appearance: alert, cooperative and appears stated age Head: Normocephalic, without  obvious abnormality, atraumatic Neck: no adenopathy, supple, symmetrical, trachea midline and thyroid normal to inspection and palpation Lungs: clear to auscultation bilaterally Breasts: normal appearance, no masses or tenderness Heart: regular rate and rhythm Abdomen: soft, non-tender; no masses,  no organomegaly Extremities: extremities normal, atraumatic, no cyanosis or edema Skin: Skin color, texture, turgor normal. No rashes or lesions Lymph nodes: Cervical, supraclavicular, and axillary nodes normal. No abnormal inguinal nodes palpated Neurologic: Grossly normal   Pelvic: External genitalia:  no lesions              Urethra:  normal appearing urethra with no masses, tenderness or lesions              Bartholin's and Skene's: normal                 Vagina: normal appearing vagina with normal color and discharge, no lesions              Cervix: anteverted              Pap taken: Yes.   Bimanual Exam:  Uterus:  normal size, contour, position, consistency, mobility, non-tender  Adnexa: no mass, fullness, tenderness               Rectovaginal: Confirms               Anus:  normal sphincter tone, no lesions  Chaperone present: Yes  A:  Well Woman with normal exam  Postmenopausal   HRT with 05/1993  PMB 2009 and stopped HRT  Osteopenia   P:   Reviewed health and wellness pertinent to exam  Pap smear taken today  Mammogram is due in 1 year if today's is normal  Order for BMD is placed  Counseled on breast self exam, mammography screening, adequate intake of calcium and vitamin D, diet and exercise return annually or prn  An After Visit Summary was printed and given to the patient.

## 2014-06-18 LAB — IPS PAP SMEAR ONLY

## 2014-06-24 NOTE — Progress Notes (Signed)
Encounter reviewed by Dr. Shed Nixon Silva.  

## 2014-09-22 ENCOUNTER — Encounter: Payer: Self-pay | Admitting: *Deleted

## 2014-10-28 ENCOUNTER — Encounter: Payer: Self-pay | Admitting: *Deleted

## 2014-11-09 ENCOUNTER — Encounter: Payer: Self-pay | Admitting: Internal Medicine

## 2014-12-30 ENCOUNTER — Encounter: Payer: Self-pay | Admitting: Family Medicine

## 2014-12-30 NOTE — Telephone Encounter (Signed)
I spoke with patient and she is aware that the cost for the zostavax today was 212.15. Patient is going to call pharmacy and check on [place].

## 2014-12-31 ENCOUNTER — Ambulatory Visit (INDEPENDENT_AMBULATORY_CARE_PROVIDER_SITE_OTHER): Payer: Medicare HMO | Admitting: *Deleted

## 2014-12-31 DIAGNOSIS — Z23 Encounter for immunization: Secondary | ICD-10-CM

## 2015-06-13 ENCOUNTER — Telehealth: Payer: Self-pay | Admitting: Family Medicine

## 2015-06-13 NOTE — Telephone Encounter (Signed)
Pt wanted to know DWM schedule - called

## 2015-06-15 ENCOUNTER — Ambulatory Visit (INDEPENDENT_AMBULATORY_CARE_PROVIDER_SITE_OTHER): Payer: Medicare HMO | Admitting: Family Medicine

## 2015-06-15 ENCOUNTER — Encounter: Payer: Self-pay | Admitting: Family Medicine

## 2015-06-15 VITALS — BP 134/69 | HR 76 | Temp 97.2°F | Ht 61.5 in | Wt 148.0 lb

## 2015-06-15 DIAGNOSIS — Z1211 Encounter for screening for malignant neoplasm of colon: Secondary | ICD-10-CM

## 2015-06-15 DIAGNOSIS — E039 Hypothyroidism, unspecified: Secondary | ICD-10-CM | POA: Diagnosis not present

## 2015-06-15 DIAGNOSIS — R12 Heartburn: Secondary | ICD-10-CM

## 2015-06-15 DIAGNOSIS — Z803 Family history of malignant neoplasm of breast: Secondary | ICD-10-CM

## 2015-06-15 DIAGNOSIS — E785 Hyperlipidemia, unspecified: Secondary | ICD-10-CM

## 2015-06-15 DIAGNOSIS — E559 Vitamin D deficiency, unspecified: Secondary | ICD-10-CM

## 2015-06-15 MED ORDER — LEVOTHYROXINE SODIUM 50 MCG PO TABS: 50.0000 ug | ORAL_TABLET | Freq: Every day | ORAL | Status: DC

## 2015-06-15 NOTE — Progress Notes (Signed)
Subjective:    Patient ID: Anne Morris, female    DOB: September 21, 1945, 70 y.o.   MRN: [id number]  HPI Pt here for follow up and management of chronic medical problems which includes hypothyroid and hyperlipidemia. She is taking medications regularly.The patient is doing well overall. Her family history was reviewed and her father died of COPD and her mother had breast cancer. She also had an older sibling that died of Alzheimer's disease. The patient is doing well other than some heartburn and she thinks it is related to her diet. It is not there constantly. She does drink 3 cups of coffee a day. She also eats fried foods. She denies any problems with swallowing nausea vomiting diarrhea blood in the stool or black tarry bowel movements, but she does have occasional loose bowel movements. She understands it caffeine to play a role with this. She has not had any problems passing her water with burning pain or frequency. Her eye exams are up-to-date. Her pelvic exam mammogram and DEXA scan are all due. She refuses the flu shot. She has no musculoskeletal complaints.      Patient Active Problem List   Diagnosis Date Noted  . Hypothyroidism 05/04/2013  . Hyperlipidemia 05/04/2013  . Vitamin D deficiency 05/04/2013  . Personal  07/13/2010  . Special screening for malignant neoplasms, colon 07/13/2010   Outpatient Encounter Prescriptions as of 06/15/2015  Medication Sig  . calcium carbonate (TUMS - DOSED IN MG ELEMENTAL CALCIUM) 500 MG chewable tablet Chew 1 tablet by mouth daily.  Marland Kitchen levothyroxine (SYNTHROID, LEVOTHROID) 50 MCG tablet Take 1 tablet (50 mcg total) by mouth daily.  . Vitamin D, Cholecalciferol, 1000 UNITS CAPS Take by mouth daily.   No facility-administered encounter medications on file as of 06/15/2015.     Review of Systems  Constitutional: Negative.   HENT: Negative.   Eyes: Negative.   Respiratory: Negative.   Cardiovascular: Negative.   Gastrointestinal: Negative.    Abdominal "burning" at times  Endocrine: Negative.   Genitourinary: Negative.   Musculoskeletal: Negative.   Skin: Negative.   Allergic/Immunologic: Negative.   Neurological: Negative.   Hematological: Negative.   Psychiatric/Behavioral: Negative.        Objective:   Physical Exam  Constitutional: She is oriented to person, place, and time. She appears well-developed and well-nourished. No distress.  HENT:  Head: Normocephalic and atraumatic.  Right Ear: External ear normal.  Left Ear: External ear normal.  Mouth/Throat: Oropharynx is clear and moist. No oropharyngeal exudate.  Slight nasal congestion  Eyes: Conjunctivae and EOM are normal. Pupils are equal, round, and reactive to light. Right eye exhibits no discharge. Left eye exhibits no discharge. No scleral icterus.  Neck: Normal range of motion. Neck supple. No thyromegaly present.  No bruits thyromegaly or anterior cervical adenopathy  Cardiovascular: Normal rate, regular rhythm, normal heart sounds and intact distal pulses.   No murmur heard. The heart has a regular rate and rhythm at 72/m  Pulmonary/Chest: Effort normal and breath sounds normal. No respiratory distress. She has no wheezes. She has no rales. She exhibits no tenderness.  Clear anteriorly and posteriorly  Abdominal: Soft. Bowel sounds are normal. She exhibits no mass. There is no tenderness. There is no rebound and no guarding.  Abdomen is soft without spleen or liver enlargement and without epigastric tenderness. There are no bruits. There is no inguinal adenopathy.  Musculoskeletal: Normal range of motion. She exhibits no edema or tenderness.  Lymphadenopathy:    She has  no cervical adenopathy.  Neurological: She is alert and oriented to person, place, and time. She has normal reflexes. No cranial nerve deficit.  Skin: Skin is warm and dry. No rash noted.  Psychiatric: She has a normal mood and affect. Her behavior is normal. Judgment and thought content  normal.  Nursing note and vitals reviewed.  BP 134/69 mmHg  Pulse 76  Temp(Src) 97.2 F (36.2 C) (Oral)  Ht 5' 1.5" (1.562 m)  Wt 148 lb (67.132 kg)  BMI 27.51 kg/m2  LMP 10/08/2007        Assessment & Plan:  1. Hypothyroidism, unspecified hypothyroidism type -Continue current treatment pending results of lab work - CBC with Differential/Platelet - Thyroid Panel With TSH  2. Hyperlipidemia -Continue aggressive therapeutic lifestyle changes which include diet and exercise - BMP8+EGFR - CBC with Differential/Platelet - Hepatic function panel - NMR, lipoprofile  3. Vitamin D deficiency -Continue with vitamin D replacement and recommended vitamin D3 1000 units the now brand that can be purchased over-the-counter at 2 local drug stores - CBC with Differential/Platelet - VITAMIN D 25 Hydroxy (Vit-D Deficiency, Fractures)  4. Special screening for malignant neoplasms, colon -The FOBT has been returned. - CBC with Differential/Platelet - Fecal occult blood, imunochemical; Future - Fecal occult blood, imunochemical  5. Heartburn -Take ranitidine twice daily before breakfast and supper for 4 weeks and then as needed. If heartburn or swallowing problems continue the patient should get back in touch with [place] for referral to her gastroenterologist  6. Family history of breast cancer -Get mammogram and pelvic exam and DEXA scan regularly  Meds ordered this encounter  Medications  . calcium carbonate (TUMS - DOSED IN MG ELEMENTAL CALCIUM) 500 MG chewable tablet    Sig: Chew 1 tablet by mouth daily.  Marland Kitchen levothyroxine (SYNTHROID, LEVOTHROID) 50 MCG tablet    Sig: Take 1 tablet (50 mcg total) by mouth daily.    Dispense:  90 tablet    Refill:  3   Patient Instructions                       Medicare Annual Wellness Visit  [facility] and the medical providers at [facility] strive to bring you the best medical care.  In doing so we not only want to address  your current medical conditions and concerns but also to detect new conditions early and prevent illness, disease and health-related problems.    Medicare offers a yearly Wellness Visit which allows our clinical staff to assess your need for preventative services including immunizations, lifestyle education, counseling to decrease risk of preventable diseases and screening for fall risk and other medical concerns.    This visit is provided free of charge (no copay) for all Medicare recipients. The clinical pharmacists at [facility] have begun to conduct these Wellness Visits which will also include a thorough review of all your medications.    As you primary medical provider recommend that you make an appointment for your Annual Wellness Visit if you have not done so already this year.  You may set up this appointment before you leave today or you may call back [phone]) and schedule an appointment.  Please make sure when you call that you mention that you are scheduling your Annual Wellness Visit with the clinical pharmacist so that the appointment may be made for the proper length of time.    Continue current medications. Continue good therapeutic lifestyle changes which include  good diet and exercise. Fall precautions discussed with patient. If an FOBT was given today- please return it to our front desk. If you are over 59 years old - you may need Prevnar 60 or the adult Pneumonia vaccine.  **Flu shots are available--- please call and schedule a FLU-CLINIC appointment**  After your visit with [place] today you will receive a survey in the mail or online from [employer] regarding your care with [place]. Please take a moment to fill this out. Your feedback is very important to [place] as you can help [place] better understand your patient needs as well as improve your experience and satisfaction. WE CARE ABOUT YOU!!!   The patient may have some early reflux esophagitis and should take  ranitidine 150 mg twice daily before breakfast and supper for at least one month and then as needed If this does not settle down the burning that she has been having and her stomach she should get back in touch with [place] and we will call her gastroenterologist to arrange for a possible endoscopy She should avoid caffeine as much as possible and foods that are highly spiced or fried. This would reduce the irritation in the stomach and should decrease the symptoms of heartburn and indigestion She should make sure that she takes her vitamin D regularly She should make sure that she follows up regularly on her pelvic exam mammogram DEXA scan and colonoscopy when you We will call with the results of the FOBT and lab work as soon as those results become available   Arrie Senate MD

## 2015-06-15 NOTE — Patient Instructions (Addendum)
Medicare Annual Wellness Visit  [facility] and the medical providers at [facility] strive to bring you the best medical care.  In doing so we not only want to address your current medical conditions and concerns but also to detect new conditions early and prevent illness, disease and health-related problems.    Medicare offers a yearly Wellness Visit which allows our clinical staff to assess your need for preventative services including immunizations, lifestyle education, counseling to decrease risk of preventable diseases and screening for fall risk and other medical concerns.    This visit is provided free of charge (no copay) for all Medicare recipients. The clinical pharmacists at [facility] have begun to conduct these Wellness Visits which will also include a thorough review of all your medications.    As you primary medical provider recommend that you make an appointment for your Annual Wellness Visit if you have not done so already this year.  You may set up this appointment before you leave today or you may call back [phone] and schedule an appointment.  Please make sure when you call that you mention that you are scheduling your Annual Wellness Visit with the clinical pharmacist so that the appointment may be made for the proper length of time.    Continue current medications. Continue good therapeutic lifestyle changes which include good diet and exercise. Fall precautions discussed with patient. If an FOBT was given today- please return it to our front desk. If you are over 75 years old - you may need Prevnar 33 or the adult Pneumonia vaccine.  **Flu shots are available--- please call and schedule a FLU-CLINIC appointment**  After your visit with [place] today you will receive a survey in the mail or online from [employer] regarding your care with [place]. Please take a moment to fill this out. Your feedback is very  important to [place] as you can help [place] better understand your patient needs as well as improve your experience and satisfaction. WE CARE ABOUT YOU!!!   The patient may have some early reflux esophagitis and should take ranitidine 150 mg twice daily before breakfast and supper for at least one month and then as needed If this does not settle down the burning that she has been having and her stomach she should get back in touch with [place] and we will call her gastroenterologist to arrange for a possible endoscopy She should avoid caffeine as much as possible and foods that are highly spiced or fried. This would reduce the irritation in the stomach and should decrease the symptoms of heartburn and indigestion She should make sure that she takes her vitamin D regularly She should make sure that she follows up regularly on her pelvic exam mammogram DEXA scan and colonoscopy when you We will call with the results of the FOBT and lab work as soon as those results become available

## 2015-06-16 ENCOUNTER — Ambulatory Visit: Payer: Medicare HMO | Admitting: Family Medicine

## 2015-06-16 LAB — BMP8+EGFR
BUN/Creatinine Ratio: 16 (ref 11–26)
BUN: 13 mg/dL (ref 8–27)
CALCIUM: 9.4 mg/dL (ref 8.7–10.3)
CO2: 21 mmol/L (ref 18–29)
CREATININE: 0.79 mg/dL (ref 0.57–1.00)
Chloride: 103 mmol/L (ref 96–106)
GFR calc Af Amer: 88 mL/min/{1.73_m2} (ref >59)
GFR calc non Af Amer: 76 mL/min/{1.73_m2} (ref >59)
GLUCOSE: 90 mg/dL (ref 65–99)
Potassium: 4.6 mmol/L (ref 3.5–5.2)
Sodium: 141 mmol/L (ref 134–144)

## 2015-06-16 LAB — CBC WITH DIFFERENTIAL/PLATELET
Basophils Absolute: 0 10*3/uL (ref 0.0–0.2)
Basos: 0 %
EOS (ABSOLUTE): 0.2 10*3/uL (ref 0.0–0.4)
Eos: 2 %
HEMATOCRIT: 40.8 % (ref 34.0–46.6)
HEMOGLOBIN: 14.1 g/dL (ref 11.1–15.9)
IMMATURE GRANULOCYTES: 0 %
Immature Grans (Abs): 0 10*3/uL (ref 0.0–0.1)
Lymphocytes Absolute: 2.1 10*3/uL (ref 0.7–3.1)
Lymphs: 32 %
MCH: 30.1 pg (ref 26.6–33.0)
MCHC: 34.6 g/dL (ref 31.5–35.7)
MCV: 87 fL (ref 79–97)
MONOCYTES: 10 %
MONOS ABS: 0.6 10*3/uL (ref 0.1–0.9)
NEUTROS PCT: 56 %
Neutrophils Absolute: 3.6 10*3/uL (ref 1.4–7.0)
Platelets: 321 10*3/uL (ref 150–379)
RBC: 4.69 x10E6/uL (ref 3.77–5.28)
RDW: 13.6 % (ref 12.3–15.4)
WBC: 6.5 10*3/uL (ref 3.4–10.8)

## 2015-06-16 LAB — HEPATIC FUNCTION PANEL
ALT: 13 IU/L (ref 0–32)
AST: 19 IU/L (ref 0–40)
Albumin: 4.2 g/dL (ref 3.5–4.8)
Alkaline Phosphatase: 108 IU/L (ref 39–117)
Bilirubin Total: 0.4 mg/dL (ref 0.0–1.2)
Bilirubin, Direct: 0.08 mg/dL (ref 0.00–0.40)
Total Protein: 6.6 g/dL (ref 6.0–8.5)

## 2015-06-16 LAB — NMR, LIPOPROFILE
Cholesterol: 218 mg/dL — ABNORMAL HIGH (ref 100–199)
HDL Cholesterol by NMR: 46 mg/dL (ref >39)
HDL PARTICLE NUMBER: 35.9 umol/L (ref >=30.5)
LDL Particle Number: 1846 nmol/L — ABNORMAL HIGH (ref <1000)
LDL Size: 20 nm (ref >20.5)
LDL-C: 131 mg/dL — ABNORMAL HIGH (ref 0–99)
LP-IR Score: 87 — ABNORMAL HIGH (ref <=45)
Small LDL Particle Number: 1121 nmol/L — ABNORMAL HIGH (ref <=527)
TRIGLYCERIDES BY NMR: 203 mg/dL — AB (ref 0–149)

## 2015-06-16 LAB — THYROID PANEL WITH TSH
FREE THYROXINE INDEX: 2.7 (ref 1.2–4.9)
T3 Uptake Ratio: 24 % (ref 24–39)
T4, Total: 11.2 ug/dL (ref 4.5–12.0)
TSH: 3.37 u[IU]/mL (ref 0.450–4.500)

## 2015-06-16 LAB — FECAL OCCULT BLOOD, IMMUNOCHEMICAL: Fecal Occult Bld: NEGATIVE

## 2015-06-16 LAB — VITAMIN D 25 HYDROXY (VIT D DEFICIENCY, FRACTURES): VIT D 25 HYDROXY: 27 ng/mL — AB (ref 30.0–100.0)

## 2015-06-17 ENCOUNTER — Other Ambulatory Visit: Payer: Self-pay | Admitting: *Deleted

## 2015-06-17 MED ORDER — VITAMIN D (ERGOCALCIFEROL) 1.25 MG (50000 UNIT) PO CAPS: 50000.0000 [IU] | ORAL_CAPSULE | ORAL | Status: DC

## 2015-06-20 ENCOUNTER — Ambulatory Visit: Payer: Medicare HMO | Admitting: Nurse Practitioner

## 2015-06-22 ENCOUNTER — Ambulatory Visit (INDEPENDENT_AMBULATORY_CARE_PROVIDER_SITE_OTHER): Payer: Medicare HMO | Admitting: Nurse Practitioner

## 2015-06-22 ENCOUNTER — Encounter: Payer: Self-pay | Admitting: Nurse Practitioner

## 2015-06-22 VITALS — BP 136/68 | HR 68 | Ht 61.5 in | Wt 148.0 lb

## 2015-06-22 DIAGNOSIS — Z0000 Encounter for general adult medical examination without abnormal findings: Secondary | ICD-10-CM

## 2015-06-22 DIAGNOSIS — Z01419 Encounter for gynecological examination (general) (routine) without abnormal findings: Secondary | ICD-10-CM | POA: Diagnosis not present

## 2015-06-22 DIAGNOSIS — M8580 Other specified disorders of bone density and structure, unspecified site: Secondary | ICD-10-CM

## 2015-06-22 DIAGNOSIS — E039 Hypothyroidism, unspecified: Secondary | ICD-10-CM

## 2015-06-22 NOTE — Progress Notes (Signed)
Patient ID: Anne Morris, female   DOB: [birth year], 70 y.o.   MRN: [id number] 70 y.o. [id number] Married  Caucasian Fe here for annual exam.  Recent elevated cholesterol and is now going back on healthy diet.  Had come off her diet during a family and Holidays.  Only new health problems is with GERD.  Patient's last menstrual period was 10/08/2007 (exact date).          Sexually active: Yes.    The current method of family planning is post menopausal status.    Exercising: No.  no regular exercise, but active with daily activities Smoker:  no  Health Maintenance: Pap: 06/17/14, Negative  MMG:06/17/14, Bi-Rads 1: Negative  Colonoscopy:07/13/10, normal, diverticulosis, repeat in 10 years, IFOB done by PCP 06/2015 BMD:04/16/08, T Score: -0.5 Spine / -1.1 Right Femur Neck / -1.5 Left Femur Neck TDaP:05/04/13 Shingles: 12/31/14 Pneumonia: 05/04/13, Prevnar 2/215 Hep C: done today Labs: 06/15/15 in EPIC, including Vit D   reports that she quit smoking about 37 years ago. She has never used smokeless tobacco. She reports that she does not drink alcohol or use illicit drugs.  Past Medical History  Diagnosis Date  . Allergy     seasonal  . Arthritis   . Thyroid disease     hypothyroidism  . Hyperlipidemia   . Osteopenia     Past Surgical History  Procedure Laterality Date  . Shoulder adhesion release Left 01/2001  . Colonoscopy  07/13/10    small polyp follow in 10 years  . Polypectomy    . Tubal ligation    . History of iud use  in her 20's    Current Outpatient Prescriptions  Medication Sig Dispense Refill  . Calcium Carbonate-Vitamin D (CALTRATE 600+D) 600-400 MG-UNIT tablet Take 1 tablet by mouth daily.    Marland Kitchen levothyroxine (SYNTHROID, LEVOTHROID) 50 MCG tablet Take 1 tablet (50 mcg total) by mouth daily. 90 tablet 3  . ranitidine (ZANTAC) 150 MG tablet Take 150 mg by mouth 2 (two) times daily.    . Vitamin D, Ergocalciferol, (DRISDOL) 50000 units CAPS capsule Take 1 capsule (50,000  Units total) by mouth every 7 (seven) days. 12 capsule 1   No current facility-administered medications for this visit.    Family History  Problem Relation Age of Onset  . Cancer Mother     breast  . Breast cancer Mother   . Emphysema Father     ROS:  Pertinent items are noted in HPI.  Otherwise, a comprehensive ROS was negative.  Exam:   BP 136/68 mmHg  Pulse 68  Ht 5' 1.5" (1.562 m)  Wt 148 lb (67.132 kg)  BMI 27.51 kg/m2  LMP 10/08/2007 (Exact Date) Height: 5' 1.5" (156.2 cm) Ht Readings from Last 3 Encounters:  06/22/15 5' 1.5" (1.562 m)  06/15/15 5' 1.5" (1.562 m)  06/17/14 5' 1.5" (1.562 m)    General appearance: alert, cooperative and appears stated age Head: Normocephalic, without obvious abnormality, atraumatic Neck: no adenopathy, supple, symmetrical, trachea midline and thyroid normal to inspection and palpation Lungs: clear to auscultation bilaterally Breasts: normal appearance, no masses or tenderness Heart: regular rate and rhythm Abdomen: soft, non-tender; no masses,  no organomegaly Extremities: extremities normal, atraumatic, no cyanosis or edema Skin: Skin color, texture, turgor normal. No rashes or lesions Lymph nodes: Cervical, supraclavicular, and axillary nodes normal. No abnormal inguinal nodes palpated Neurologic: Grossly normal   Pelvic: External genitalia:  no lesions  Urethra:  normal appearing urethra with no masses, tenderness or lesions              Bartholin's and Skene's: normal                 Vagina: normal appearing vagina with normal color and discharge, no lesions              Cervix: anteverted              Pap taken: No. Bimanual Exam:  Uterus:  normal size, contour, position, consistency, mobility, non-tender              Adnexa: no mass, fullness, tenderness               Rectovaginal: declines, diarrhea this am               Anus:  declines  Chaperone present: no  A:  Well Woman with normal  exam  Postmenopausal HRT with 05/1993 PMB 2009 and stopped HRT Osteopenia with Vit D deficiency   P:   Reviewed health and wellness pertinent to exam  Pap smear as above  Mammogram is due and will schedule  Declines getting another BMD  Will follow with labs  Counseled on breast self exam, mammography screening, adequate intake of calcium and vitamin D, diet and exercise, Kegel's exercises return annually or prn  An After Visit Summary was printed and given to the patient.

## 2015-06-22 NOTE — Patient Instructions (Signed)

## 2015-06-23 LAB — HEPATITIS C ANTIBODY: HCV Ab: NEGATIVE

## 2015-06-23 NOTE — Progress Notes (Signed)
Encounter reviewed by Dr. Brook Amundson C. Silva.  

## 2015-08-22 ENCOUNTER — Telehealth: Payer: Self-pay | Admitting: Nurse Practitioner

## 2015-08-22 NOTE — Telephone Encounter (Signed)
Encounter not needed

## 2015-09-20 ENCOUNTER — Other Ambulatory Visit: Payer: Self-pay | Admitting: Nurse Practitioner

## 2015-09-20 DIAGNOSIS — Z1231 Encounter for screening mammogram for malignant neoplasm of breast: Secondary | ICD-10-CM

## 2015-10-11 ENCOUNTER — Ambulatory Visit
Admission: RE | Admit: 2015-10-11 | Discharge: 2015-10-11 | Disposition: A | Discharge status: Home / Self Care | Payer: Medicare HMO | Source: Ambulatory Visit | Attending: Nurse Practitioner | Admitting: Nurse Practitioner

## 2015-10-11 DIAGNOSIS — Z1231 Encounter for screening mammogram for malignant neoplasm of breast: Secondary | ICD-10-CM | POA: Diagnosis not present

## 2015-10-31 ENCOUNTER — Encounter: Payer: Self-pay | Admitting: Family Medicine

## 2015-10-31 ENCOUNTER — Ambulatory Visit (INDEPENDENT_AMBULATORY_CARE_PROVIDER_SITE_OTHER): Payer: Medicare HMO | Admitting: Family Medicine

## 2015-10-31 VITALS — BP 146/65 | HR 64 | Temp 97.6°F | Ht 61.5 in | Wt 146.0 lb

## 2015-10-31 DIAGNOSIS — B009 Herpesviral infection, unspecified: Secondary | ICD-10-CM

## 2015-10-31 MED ORDER — VALACYCLOVIR HCL 1 G PO TABS: ORAL_TABLET | ORAL | 0 refills | Status: DC

## 2015-10-31 NOTE — Patient Instructions (Addendum)
Take pills as directed. Take 2 g of Valtrex and repeat 12 hours later. You can use these pills in the future as directed by taking 2 g and 12 hours later 2 g more. It is always better with herpes simplex to start the treatment when the symptoms began and not after the rash has appeared If the rash continues to get worse get back in touch with [place].

## 2015-10-31 NOTE — Progress Notes (Signed)
Subjective:    Patient ID: Anne Morris, female    DOB: 04/30/1945, 70 y.o.   MRN: [id number]  HPI Patient here today for rash. She first noticed last week while she was at the beach on Vacation. The first beginnings of the rash was at the right lip area lower lip area. She does have a history of cold sores. She later got an area right next to her right eye. She says she been pulling weeds recently and may have pulled some poison ivy. This was burning and stinging and didn't have a lot of itching.    Patient Active Problem List   Diagnosis Date Noted  . Family history of breast cancer 06/15/2015  . Hypothyroidism 05/04/2013  . Hyperlipidemia 05/04/2013  . Vitamin D deficiency 05/04/2013  . Personal  07/13/2010  . Special screening for malignant neoplasms, colon 07/13/2010   Outpatient Encounter Prescriptions as of 10/31/2015  Medication Sig  . Calcium Carbonate-Vitamin D (CALTRATE 600+D) 600-400 MG-UNIT tablet Take 1 tablet by mouth daily.  Marland Kitchen levothyroxine (SYNTHROID, LEVOTHROID) 50 MCG tablet Take 1 tablet (50 mcg total) by mouth daily.  . ranitidine (ZANTAC) 150 MG tablet Take 150 mg by mouth 2 (two) times daily.  . Vitamin D, Ergocalciferol, (DRISDOL) 50000 units CAPS capsule Take 1 capsule (50,000 Units total) by mouth every 7 (seven) days.   No facility-administered encounter medications on file as of 10/31/2015.       Review of Systems  Constitutional: Negative.   HENT: Negative.   Eyes: Negative.   Respiratory: Negative.   Cardiovascular: Negative.   Gastrointestinal: Negative.   Endocrine: Negative.   Genitourinary: Negative.   Musculoskeletal: Negative.   Skin: Positive for rash (poison oak).  Allergic/Immunologic: Negative.   Neurological: Negative.   Hematological: Negative.   Psychiatric/Behavioral: Negative.        Objective:   Physical Exam  Constitutional: She is oriented to person, place, and time. She appears well-developed and well-nourished. No  distress.  HENT:  Head: Normocephalic.  Eyes: Conjunctivae and EOM are normal. Pupils are equal, round, and reactive to light. Right eye exhibits no discharge. Left eye exhibits no discharge. Scleral icterus is present.  Neck: Normal range of motion.  Neurological: She is alert and oriented to person, place, and time.  Skin: Skin is warm and dry. Rash noted. There is erythema. No pallor.  Appears to be a small drying rash at the corner of her right mouth adjacent to her lower lip.  Psychiatric: She has a normal mood and affect. Her behavior is normal. Judgment and thought content normal.  Nursing note and vitals reviewed.  BP (!) 146/65 (BP Location: Left Arm)   Pulse 64   Temp 97.6 F (36.4 C) (Oral)   Ht 5' 1.5" (1.562 m)   Wt 146 lb (66.2 kg)   LMP 10/08/2007 (Exact Date)   BMI 27.14 kg/m         Assessment & Plan:  1. Herpes simplex -Take Valtrex as directed 2 g and 12 hours later 2 g more.  Patient Instructions  Take pills as directed. Take 2 g of Valtrex and repeat 12 hours later. You can use these pills in the future as directed by taking 2 g and 12 hours later 2 g more. It is always better with herpes simplex to start the treatment when the symptoms began and not after the rash has appeared If the rash continues to get worse get back in touch with [place].  Damaris Schooner.  Laurance Flatten MD

## 2015-11-16 DIAGNOSIS — R69 Illness, unspecified: Secondary | ICD-10-CM | POA: Diagnosis not present

## 2015-12-15 DIAGNOSIS — H5200 Hypermetropia, unspecified eye: Secondary | ICD-10-CM | POA: Diagnosis not present

## 2015-12-15 DIAGNOSIS — H2510 Age-related nuclear cataract, unspecified eye: Secondary | ICD-10-CM | POA: Diagnosis not present

## 2015-12-15 DIAGNOSIS — Z0100 Encounter for examination of eyes and vision without abnormal findings: Secondary | ICD-10-CM | POA: Diagnosis not present

## 2016-02-16 DIAGNOSIS — D225 Melanocytic nevi of trunk: Secondary | ICD-10-CM | POA: Diagnosis not present

## 2016-02-16 DIAGNOSIS — L821 Other seborrheic keratosis: Secondary | ICD-10-CM | POA: Diagnosis not present

## 2016-03-28 DIAGNOSIS — Z0100 Encounter for examination of eyes and vision without abnormal findings: Secondary | ICD-10-CM | POA: Diagnosis not present

## 2016-05-24 DIAGNOSIS — R69 Illness, unspecified: Secondary | ICD-10-CM | POA: Diagnosis not present

## 2016-06-20 ENCOUNTER — Encounter: Payer: Self-pay | Admitting: Family Medicine

## 2016-06-20 ENCOUNTER — Ambulatory Visit (INDEPENDENT_AMBULATORY_CARE_PROVIDER_SITE_OTHER): Payer: Medicare HMO | Admitting: Family Medicine

## 2016-06-20 ENCOUNTER — Ambulatory Visit (INDEPENDENT_AMBULATORY_CARE_PROVIDER_SITE_OTHER): Payer: Medicare HMO

## 2016-06-20 VITALS — BP 111/61 | HR 65 | Temp 97.3°F | Ht 61.5 in | Wt 145.0 lb

## 2016-06-20 DIAGNOSIS — E559 Vitamin D deficiency, unspecified: Secondary | ICD-10-CM

## 2016-06-20 DIAGNOSIS — E7800 Pure hypercholesterolemia, unspecified: Secondary | ICD-10-CM

## 2016-06-20 DIAGNOSIS — H6122 Impacted cerumen, left ear: Secondary | ICD-10-CM

## 2016-06-20 DIAGNOSIS — Z1211 Encounter for screening for malignant neoplasm of colon: Secondary | ICD-10-CM

## 2016-06-20 DIAGNOSIS — E039 Hypothyroidism, unspecified: Secondary | ICD-10-CM

## 2016-06-20 MED ORDER — LEVOTHYROXINE SODIUM 50 MCG PO TABS: 50.0000 ug | ORAL_TABLET | Freq: Every day | ORAL | 3 refills | Status: DC

## 2016-06-20 NOTE — Patient Instructions (Addendum)
Medicare Annual Wellness Visit  [facility] and the medical providers at [facility] strive to bring you the best medical care.  In doing so we not only want to address your current medical conditions and concerns but also to detect new conditions early and prevent illness, disease and health-related problems.    Medicare offers a yearly Wellness Visit which allows our clinical staff to assess your need for preventative services including immunizations, lifestyle education, counseling to decrease risk of preventable diseases and screening for fall risk and other medical concerns.    This visit is provided free of charge (no copay) for all Medicare recipients. The clinical pharmacists at [facility] have begun to conduct these Wellness Visits which will also include a thorough review of all your medications.    As you primary medical provider recommend that you make an appointment for your Annual Wellness Visit if you have not done so already this year.  You may set up this appointment before you leave today or you may call back ([phone]) and schedule an appointment.  Please make sure when you call that you mention that you are scheduling your Annual Wellness Visit with the clinical pharmacist so that the appointment may be made for the proper length of time.     Continue current medications. Continue good therapeutic lifestyle changes which include good diet and exercise. Fall precautions discussed with patient. If an FOBT was given today- please return it to our front desk. If you are over 77 years old - you may need Prevnar 8 or the adult Pneumonia vaccine.  **Flu shots are available--- please call and schedule a FLU-CLINIC appointment**  After your visit with [place] today you will receive a survey in the mail or online from [employer] regarding your care with [place]. Please take a moment to fill this out. Your feedback is very  important to [place] as you can help [place] better understand your patient needs as well as improve your experience and satisfaction. WE CARE ABOUT YOU!!!   The patient should continue to use good hand and pulmonary hygiene She should follow-up with her gynecologist for her pelvic exam as planned She should get her mammogram yearly She should use some Debrox in the left ear canal for 2 or 3 nights in a row and repeat this procedure again in one week. This should soften and help the ear wax to come out on its own.

## 2016-06-20 NOTE — Progress Notes (Signed)
Subjective:    Patient ID: Anne Morris, female    DOB: 03/24/1946, 71 y.o.   MRN: [id number]  HPI Pt here for follow up and management of chronic medical problems which includes hypothyroid and hyperlipidemia. She is taking medication regularly.The patient is doing well today with no particular complaints. She is due to get a chest x-ray and this will be done. She will get lab work today and this is already been drawn. She brought an FOBT back today for recheck. She is requesting refills on all her medicines. Her vital signs are stable. She is currently taking vitamin D replacement thyroid replacement at 50 g and ranitidine for her stomach 150 mg. She has Valtrex that she takes for fever blisters. She does not take the Zantac regularly. She denies any chest pain pressure or palpitations or shortness of breath. She denies any trouble with swallowing heartburn indigestion nausea vomiting diarrhea or blood in the stool. She did return her FOBT today. She denies any trouble with passing her water. She is due and has scheduled a couple days a pelvic exam. Her family history was reviewed and her mother died of breast cancer at 89 and her final had heart disease. She has one sister that is in a nursing home with obesity and one brother that is alive and well.    Patient Active Problem List   Diagnosis Date Noted  . Family history of breast cancer 06/15/2015  . Hypothyroidism 05/04/2013  . Hyperlipidemia 05/04/2013  . Vitamin D deficiency 05/04/2013  . Personal  07/13/2010  . Special screening for malignant neoplasms, colon 07/13/2010   Outpatient Encounter Prescriptions as of 06/20/2016  Medication Sig  . Calcium Carbonate-Vitamin D (CALTRATE 600+D) 600-400 MG-UNIT tablet Take 1 tablet by mouth daily.  Marland Kitchen levothyroxine (SYNTHROID, LEVOTHROID) 50 MCG tablet Take 1 tablet (50 mcg total) by mouth daily.  Marland Kitchen OVER THE COUNTER MEDICATION [place] - one a day  . ranitidine (ZANTAC) 150 MG tablet Take 150  mg by mouth 2 (two) times daily.  . valACYclovir (VALTREX) 1000 MG tablet Take 2 tabs po now and repeat in 12 hours. Reuse as directed  . Vitamin D, Ergocalciferol, (DRISDOL) 50000 units CAPS capsule Take 1 capsule (50,000 Units total) by mouth every 7 (seven) days.   No facility-administered encounter medications on file as of 06/20/2016.       Review of Systems  Constitutional: Negative.   HENT: Negative.   Eyes: Negative.   Respiratory: Negative.   Cardiovascular: Negative.   Gastrointestinal: Negative.   Endocrine: Negative.   Genitourinary: Negative.   Musculoskeletal: Negative.   Skin: Negative.   Allergic/Immunologic: Negative.   Neurological: Negative.   Hematological: Negative.   Psychiatric/Behavioral: Negative.        Objective:   Physical Exam  Constitutional: She is oriented to person, place, and time. She appears well-developed and well-nourished. No distress.  HENT:  Head: Normocephalic and atraumatic.  Right Ear: External ear normal.  Nose: Nose normal.  Mouth/Throat: Oropharynx is clear and moist.  Cerumen left ear canal   Eyes: Conjunctivae and EOM are normal. Pupils are equal, round, and reactive to light. Right eye exhibits no discharge. Left eye exhibits no discharge. No scleral icterus.  Recent eye exam in the past couple weeks was normal.  Neck: Normal range of motion. Neck supple. No JVD present. No thyromegaly present.  No bruits thyromegaly or anterior cervical adenopathy  Cardiovascular: Normal rate, regular rhythm, normal heart sounds and intact distal pulses.  No murmur heard. The heart is regular at 60/m  Pulmonary/Chest: Effort normal and breath sounds normal. No respiratory distress. She has no wheezes. She has no rales. She exhibits no tenderness.  Clear anteriorly and posteriorly  Abdominal: Soft. Bowel sounds are normal. She exhibits no mass. There is no tenderness. There is no rebound and no guarding.  No abdominal pain tenderness or  organ enlargement or bruits  Musculoskeletal: Normal range of motion. She exhibits no edema.  Lymphadenopathy:    She has no cervical adenopathy.  Neurological: She is alert and oriented to person, place, and time. She has normal reflexes. No cranial nerve deficit.  Skin: Skin is warm and dry. No rash noted.  Dry skin  Psychiatric: She has a normal mood and affect. Her behavior is normal. Judgment and thought content normal.  Nursing note and vitals reviewed.  BP 111/61 (BP Location: Left Arm)   Pulse 65   Temp 97.3 F (36.3 C) (Oral)   Ht 5' 1.5" (1.562 m)   Wt 145 lb (65.8 kg)   LMP 10/08/2007 (Exact Date)   BMI 26.95 kg/m   Chest x-ray with results pending====      Assessment & Plan:  1. Pure hypercholesterolemia -Continue with aggressive therapeutic lifestyle changes in medication if needed pending results of lab work - CBC with Differential/Platelet - BMP8+EGFR - Hepatic function panel - NMR, lipoprofile - DG Chest 2 View; Future  2. Vitamin D deficiency -Continue with vitamin D replacement - CBC with Differential/Platelet - VITAMIN D 25 Hydroxy (Vit-D Deficiency, Fractures) - DG Chest 2 View; Future  3. Hypothyroidism, unspecified type -Continue with current thyroid treatment pending results of lab work - Thyroid Panel With TSH - DG Chest 2 View; Future  4. Screen for colon cancer -Return the FOBT and get next colonoscopy in April 2022 - Fecal occult blood, imunochemical  5. Excessive cerumen in left ear canal -Use Debrox as directed, return to clinic if needed for ear irrigation  Meds ordered this encounter  Medications  . OVER THE COUNTER MEDICATION    Sig: [place] - one a day  . levothyroxine (SYNTHROID, LEVOTHROID) 50 MCG tablet    Sig: Take 1 tablet (50 mcg total) by mouth daily.    Dispense:  90 tablet    Refill:  3   Patient Instructions                       Medicare Annual Wellness Visit  [facility] and the medical providers at [facility] strive to bring you the best medical care.  In doing so we not only want to address your current medical conditions and concerns but also to detect new conditions early and prevent illness, disease and health-related problems.    Medicare offers a yearly Wellness Visit which allows our clinical staff to assess your need for preventative services including immunizations, lifestyle education, counseling to decrease risk of preventable diseases and screening for fall risk and other medical concerns.    This visit is provided free of charge (no copay) for all Medicare recipients. The clinical pharmacists at [facility] have begun to conduct these Wellness Visits which will also include a thorough review of all your medications.    As you primary medical provider recommend that you make an appointment for your Annual Wellness Visit if you have not done so already this year.  You may set up this appointment before you leave today or you  may call back ([phone]) and schedule an appointment.  Please make sure when you call that you mention that you are scheduling your Annual Wellness Visit with the clinical pharmacist so that the appointment may be made for the proper length of time.     Continue current medications. Continue good therapeutic lifestyle changes which include good diet and exercise. Fall precautions discussed with patient. If an FOBT was given today- please return it to our front desk. If you are over 29 years old - you may need Prevnar 22 or the adult Pneumonia vaccine.  **Flu shots are available--- please call and schedule a FLU-CLINIC appointment**  After your visit with [place] today you will receive a survey in the mail or online from [employer] regarding your care with [place]. Please take a moment to fill this out. Your feedback is very important to [place] as you can help [place] better understand your patient needs as well as improve your experience and  satisfaction. WE CARE ABOUT YOU!!!   The patient should continue to use good hand and pulmonary hygiene She should follow-up with her gynecologist for her pelvic exam as planned She should get her mammogram yearly She should use some Debrox in the left ear canal for 2 or 3 nights in a row and repeat this procedure again in one week. This should soften and help the ear wax to come out on its own.  Arrie Senate MD

## 2016-06-21 LAB — CBC WITH DIFFERENTIAL/PLATELET
BASOS ABS: 0 10*3/uL (ref 0.0–0.2)
Basos: 0 %
EOS (ABSOLUTE): 0.1 10*3/uL (ref 0.0–0.4)
EOS: 2 %
HEMATOCRIT: 42 % (ref 34.0–46.6)
HEMOGLOBIN: 14 g/dL (ref 11.1–15.9)
IMMATURE GRANS (ABS): 0 10*3/uL (ref 0.0–0.1)
Immature Granulocytes: 0 %
LYMPHS ABS: 2.1 10*3/uL (ref 0.7–3.1)
LYMPHS: 34 %
MCH: 29.7 pg (ref 26.6–33.0)
MCHC: 33.3 g/dL (ref 31.5–35.7)
MCV: 89 fL (ref 79–97)
Monocytes Absolute: 0.5 10*3/uL (ref 0.1–0.9)
Monocytes: 8 %
NEUTROS ABS: 3.5 10*3/uL (ref 1.4–7.0)
Neutrophils: 56 %
Platelets: 292 10*3/uL (ref 150–379)
RBC: 4.71 x10E6/uL (ref 3.77–5.28)
RDW: 14.5 % (ref 12.3–15.4)
WBC: 6.2 10*3/uL (ref 3.4–10.8)

## 2016-06-21 LAB — NMR, LIPOPROFILE
Cholesterol: 201 mg/dL — ABNORMAL HIGH (ref 100–199)
HDL Cholesterol by NMR: 45 mg/dL (ref >39)
HDL Particle Number: 32.4 umol/L (ref >=30.5)
LDL PARTICLE NUMBER: 1341 nmol/L — AB (ref <1000)
LDL SIZE: 20.8 nm (ref >20.5)
LDL-C: 119 mg/dL — ABNORMAL HIGH (ref 0–99)
LP-IR SCORE: 66 — AB (ref <=45)
Small LDL Particle Number: 267 nmol/L (ref <=527)
Triglycerides by NMR: 185 mg/dL — ABNORMAL HIGH (ref 0–149)

## 2016-06-21 LAB — BMP8+EGFR
BUN/Creatinine Ratio: 18 (ref 12–28)
BUN: 16 mg/dL (ref 8–27)
CO2: 23 mmol/L (ref 18–29)
CREATININE: 0.87 mg/dL (ref 0.57–1.00)
Calcium: 9.2 mg/dL (ref 8.7–10.3)
Chloride: 104 mmol/L (ref 96–106)
GFR calc Af Amer: 78 mL/min/{1.73_m2} (ref >59)
GFR, EST NON AFRICAN AMERICAN: 67 mL/min/{1.73_m2} (ref >59)
GLUCOSE: 88 mg/dL (ref 65–99)
Potassium: 4.3 mmol/L (ref 3.5–5.2)
Sodium: 144 mmol/L (ref 134–144)

## 2016-06-21 LAB — HEPATIC FUNCTION PANEL
ALBUMIN: 4.1 g/dL (ref 3.5–4.8)
ALK PHOS: 91 IU/L (ref 39–117)
ALT: 19 IU/L (ref 0–32)
AST: 20 IU/L (ref 0–40)
BILIRUBIN TOTAL: 0.5 mg/dL (ref 0.0–1.2)
BILIRUBIN, DIRECT: 0.13 mg/dL (ref 0.00–0.40)
TOTAL PROTEIN: 6.3 g/dL (ref 6.0–8.5)

## 2016-06-21 LAB — THYROID PANEL WITH TSH
Free Thyroxine Index: 2.1 (ref 1.2–4.9)
T3 Uptake Ratio: 25 % (ref 24–39)
T4, Total: 8.4 ug/dL (ref 4.5–12.0)
TSH: 2.64 u[IU]/mL (ref 0.450–4.500)

## 2016-06-21 LAB — VITAMIN D 25 HYDROXY (VIT D DEFICIENCY, FRACTURES): Vit D, 25-Hydroxy: 47.2 ng/mL (ref 30.0–100.0)

## 2016-06-22 LAB — FECAL OCCULT BLOOD, IMMUNOCHEMICAL: Fecal Occult Bld: NEGATIVE

## 2016-06-26 ENCOUNTER — Encounter: Payer: Self-pay | Admitting: Nurse Practitioner

## 2016-06-26 ENCOUNTER — Ambulatory Visit (INDEPENDENT_AMBULATORY_CARE_PROVIDER_SITE_OTHER): Payer: Medicare HMO | Admitting: Nurse Practitioner

## 2016-06-26 VITALS — BP 124/66 | HR 64 | Ht 62.5 in | Wt 147.0 lb

## 2016-06-26 DIAGNOSIS — E039 Hypothyroidism, unspecified: Secondary | ICD-10-CM | POA: Diagnosis not present

## 2016-06-26 DIAGNOSIS — Z0000 Encounter for general adult medical examination without abnormal findings: Secondary | ICD-10-CM | POA: Diagnosis not present

## 2016-06-26 DIAGNOSIS — Z01411 Encounter for gynecological examination (general) (routine) with abnormal findings: Secondary | ICD-10-CM | POA: Diagnosis not present

## 2016-06-26 DIAGNOSIS — M8588 Other specified disorders of bone density and structure, other site: Secondary | ICD-10-CM | POA: Diagnosis not present

## 2016-06-26 NOTE — Progress Notes (Signed)
Patient ID: Anne Morris, female   DOB: [birth year], 71 y.o.   MRN: [id number]  71 y.o. G58P1001 Married  Caucasian Fe here for annual exam. No new health problems.   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  Patient's last menstrual period was 10/08/2007 (exact date).          Sexually active: Yes.    The current method of family planning is tubal ligation and post menopausal status.    Exercising: no regular exercise but active in daily activities Smoker:  no  Health Maintenance: Pap: 06/17/14, Negative  04/13/09, Negative MMG: 10/11/15, Bi-Rads 1: Negative Colonoscopy:07/13/10, normal, diverticulosis, repeat in 10 years, IFOB done 06/20/16 BMD:04/16/08, T Score: -0.5 Spine / -1.1 Right Femur Neck / -1.5 Left Femur Neck TDaP:05/04/13 Shingles: 12/31/14 Pneumonia: 05/04/13 Pneumovax Hep C: 06/22/15 Labs: PCP takes care of labs, in EPIC   reports that she quit smoking about 38 years ago. She has never used smokeless tobacco. She reports that she does not drink alcohol or use drugs.  Past Medical History:  Diagnosis Date  . Allergy    seasonal  . Arthritis   . Hyperlipidemia   . Osteopenia   . Thyroid disease    hypothyroidism    Past Surgical History:  Procedure Laterality Date  . COLONOSCOPY  07/13/10   small polyp follow in 10 years  . history of iud use  in her 4's  . POLYPECTOMY    . SHOULDER ADHESION RELEASE Left 01/2001  . TUBAL LIGATION      Current Outpatient Prescriptions  Medication Sig Dispense Refill  . Calcium Carbonate-Vitamin D (CALTRATE 600+D) 600-400 MG-UNIT tablet Take 1 tablet by mouth daily.    Marland Kitchen levothyroxine (SYNTHROID, LEVOTHROID) 50 MCG tablet Take 1 tablet (50 mcg total) by mouth daily. 90 tablet 3  . OVER THE COUNTER MEDICATION [place] - one a day     No current facility-administered medications for this visit.     Family History  Problem Relation Age of Onset  . Cancer Mother     breast  . Breast  cancer Mother   . Emphysema Father     ROS:  Pertinent items are noted in HPI.  Otherwise, a comprehensive ROS was negative.  Exam:   BP 124/66 (BP Location: Right Arm, Patient Position: Sitting, Cuff Size: Normal)   Pulse 64   Ht 5' 2.5" (1.588 m)   Wt 147 lb (66.7 kg)   LMP 10/08/2007 (Exact Date)   BMI 26.46 kg/m  Height: 5' 2.5" (158.8 cm) Ht Readings from Last 3 Encounters:  06/26/16 5' 2.5" (1.588 m)  06/20/16 5' 1.5" (1.562 m)  10/31/15 5' 1.5" (1.562 m)    General appearance: alert, cooperative and appears stated age Head: Normocephalic, without obvious abnormality, atraumatic Neck: no adenopathy, supple, symmetrical, trachea midline and thyroid normal to inspection and palpation Lungs: clear to auscultation bilaterally Breasts: normal appearance, no masses or tenderness Heart: regular rate and rhythm Abdomen: soft, non-tender; no masses,  no organomegaly Extremities: extremities normal, atraumatic, no cyanosis or edema Skin: Skin color, texture, turgor normal. No rashes or lesions Lymph nodes: Cervical, supraclavicular, and axillary nodes normal. No abnormal inguinal nodes palpated Neurologic: Grossly normal   Pelvic: External genitalia:  no lesions              Urethra:  normal appearing urethra with no masses, tenderness or lesions  Bartholin's and Skene's: normal                 Vagina: normal appearing vagina with normal color and discharge, no lesions              Cervix: anteverted              Pap taken: No. Bimanual Exam:  Uterus:  normal size, contour, position, consistency, mobility, non-tender              Adnexa: no mass, fullness, tenderness               Rectovaginal: Confirms               Anus:  normal sphincter tone, no lesions  Chaperone present: yes  A:  Well Woman with normal exam   Postmenopausal HRT with 05/1993 PMB 2009 and stopped HRT Osteopenia with Vit D deficiency  Hypothyroid   P:    Reviewed health and wellness pertinent to exam  Pap smear not done  Mammogram is due 09/2016  Declines repeat BMD at this time  Counseled on breast self exam, mammography screening, adequate intake of calcium and vitamin D, diet and exercise return annually or prn  An After Visit Summary was printed and given to the patient.

## 2016-06-26 NOTE — Patient Instructions (Addendum)

## 2016-06-30 NOTE — Progress Notes (Signed)
Reviewed personally.  M. Suzanne Laketia Vicknair, MD.  

## 2016-11-05 ENCOUNTER — Telehealth: Payer: Self-pay | Admitting: Obstetrics and Gynecology

## 2016-11-05 NOTE — Telephone Encounter (Signed)
Left patient a message to call back to reschedule a future appointment that was cancelled by the provider. °

## 2016-12-10 DIAGNOSIS — R69 Illness, unspecified: Secondary | ICD-10-CM | POA: Diagnosis not present

## 2017-01-10 DIAGNOSIS — D225 Melanocytic nevi of trunk: Secondary | ICD-10-CM | POA: Diagnosis not present

## 2017-01-10 DIAGNOSIS — Z1283 Encounter for screening for malignant neoplasm of skin: Secondary | ICD-10-CM | POA: Diagnosis not present

## 2017-06-18 DIAGNOSIS — R69 Illness, unspecified: Secondary | ICD-10-CM | POA: Diagnosis not present

## 2017-06-20 ENCOUNTER — Other Ambulatory Visit: Payer: Self-pay | Admitting: Family Medicine

## 2017-06-20 DIAGNOSIS — Z1231 Encounter for screening mammogram for malignant neoplasm of breast: Secondary | ICD-10-CM

## 2017-06-21 ENCOUNTER — Encounter: Payer: Self-pay | Admitting: Family Medicine

## 2017-06-21 ENCOUNTER — Ambulatory Visit (INDEPENDENT_AMBULATORY_CARE_PROVIDER_SITE_OTHER): Payer: Medicare HMO | Admitting: Family Medicine

## 2017-06-21 VITALS — BP 129/66 | HR 63 | Temp 97.6°F | Ht 62.5 in | Wt 149.0 lb

## 2017-06-21 DIAGNOSIS — E039 Hypothyroidism, unspecified: Secondary | ICD-10-CM | POA: Diagnosis not present

## 2017-06-21 DIAGNOSIS — Z1211 Encounter for screening for malignant neoplasm of colon: Secondary | ICD-10-CM

## 2017-06-21 DIAGNOSIS — E7800 Pure hypercholesterolemia, unspecified: Secondary | ICD-10-CM

## 2017-06-21 DIAGNOSIS — E559 Vitamin D deficiency, unspecified: Secondary | ICD-10-CM

## 2017-06-21 NOTE — Progress Notes (Signed)
 Subjective:    Patient ID: Anne Morris, female    DOB: 05/26/1945, 72 y.o.   MRN: [id number]  HPI Pt here for follow up and management of chronic medical problems which includes hypothyroid and hyperlipidemia. She is taking medication regularly.  The patient is doing well today and as usual has no complaints.  This is a yearly checkup for this patient.  She is up-to-date on her mammograms.  She had her last pelvic exam about 1 year ago.  She will get lab work today.  She has an FOBT at home which she will bring back.  Her last colonoscopy was in April 2012.  She has no specific complaints and is not needing any refills.  She is on thyroid replacement and calcium and hopefully vitamin D.  There is a positive family history of breast cancer.  The patient has elevated cholesterol and is on thyroid replacement for hypothyroidism she also has vitamin D deficiency.  The patient is pleasant and alert.  She denies any chest pain pressure tightness or shortness of breath.  She denies any problem with her stomach including nausea vomiting diarrhea blood in the stool or black tarry bowel movements.  She has not had any change in bowel habits.  She is passing her water without problems.  Her family history is positive for breast cancer and she knows that it is important that she gets her mammograms yearly.  She is up-to-date on her eye exams.  She does take vitamin D3 5000 units daily along with her calcium.  She mentions that her levothyroxine has been recalled.  We will make sure that she gets the correct medicine.     Patient Active Problem List   Diagnosis Date Noted  . Family history of breast cancer 06/15/2015  . Hypothyroidism 05/04/2013  . Hyperlipidemia 05/04/2013  . Vitamin D deficiency 05/04/2013  . Personal  07/13/2010  . Special screening for malignant neoplasms, colon 07/13/2010   Outpatient Encounter Medications as of 06/21/2017  Medication Sig  . Calcium Carbonate-Vitamin D (CALTRATE  600+D) 600-400 MG-UNIT tablet Take 1 tablet by mouth daily.  . levothyroxine (SYNTHROID, LEVOTHROID) 50 MCG tablet Take 1 tablet (50 mcg total) by mouth daily.  . [DISCONTINUED] OVER THE COUNTER MEDICATION [place] - one a day   No facility-administered encounter medications on file as of 06/21/2017.      Review of Systems  Constitutional: Negative.   HENT: Negative.   Eyes: Negative.   Respiratory: Negative.   Cardiovascular: Negative.   Gastrointestinal: Negative.   Endocrine: Negative.   Genitourinary: Negative.   Musculoskeletal: Negative.   Skin: Negative.   Allergic/Immunologic: Negative.   Neurological: Negative.   Hematological: Negative.   Psychiatric/Behavioral: Negative.        Objective:   Physical Exam  Constitutional: She is oriented to person, place, and time. She appears well-developed and well-nourished. No distress.  The patient is pleasant and alert  HENT:  Head: Normocephalic and atraumatic.  Right Ear: External ear normal.  Left Ear: External ear normal.  Nose: Nose normal.  Mouth/Throat: Oropharynx is clear and moist.  Minimal nasal congestion  Eyes: Pupils are equal, round, and reactive to light. Conjunctivae and EOM are normal. Right eye exhibits no discharge. Left eye exhibits no discharge. No scleral icterus.  Neck: Normal range of motion. Neck supple. No thyromegaly present.  No bruits thyromegaly or anterior cervical adenopathy  Cardiovascular: Normal rate, regular rhythm, normal heart sounds and intact distal pulses.  No murmur heard.   The heart is regular at 60/min  Pulmonary/Chest: Effort normal and breath sounds normal. No respiratory distress. She has no wheezes. She has no rales.  Clear anteriorly and posteriorly   Abdominal: Soft. Bowel sounds are normal. She exhibits no mass. There is no tenderness. There is no rebound and no guarding.  No liver or spleen enlargement no epigastric tenderness no masses no bruits and no inguinal adenopathy    Musculoskeletal: Normal range of motion. She exhibits no edema.  Lymphadenopathy:    She has no cervical adenopathy.  Neurological: She is alert and oriented to person, place, and time. She has normal reflexes. No cranial nerve deficit.  Skin: Skin is warm and dry. No rash noted.  Psychiatric: She has a normal mood and affect. Her behavior is normal. Judgment and thought content normal.  Nursing note and vitals reviewed.   BP 129/66 (BP Location: Left Arm)   Pulse 63   Temp 97.6 F (36.4 C) (Oral)   Ht 5' 2.5" (1.588 m)   Wt 149 lb (67.6 kg)   LMP 10/08/2007 (Exact Date)   BMI 26.82 kg/m        Assessment & Plan:  1. Pure hypercholesterolemia -Continue aggressive therapeutic lifestyle changes pending results of lab work - BMP8+EGFR - CBC with Differential/Platelet - Lipid panel - Hepatic function panel  2. Vitamin D deficiency -Continue vitamin D replacement which the patient says is 5000 units daily.  Continue with calcium.  Continue with weightbearing exercise. - CBC with Differential/Platelet - VITAMIN D 25 Hydroxy (Vit-D Deficiency, Fractures)  3. Hypothyroidism, unspecified type -Continue with current treatment pending results of lab work - CBC with Differential/Platelet - Thyroid Panel With TSH  4. Screen for colon cancer -Return the FOBT card.  Next colonoscopy is not due until April 2022. - CBC with Differential/Platelet - Fecal occult blood, imunochemical   Patient Instructions                       Medicare Annual Wellness Visit  [facility] and the medical providers at [facility] strive to bring you the best medical care.  In doing so we not only want to address your current medical conditions and concerns but also to detect new conditions early and prevent illness, disease and health-related problems.    Medicare offers a yearly Wellness Visit which allows our clinical staff to assess your need for preventative services  including immunizations, lifestyle education, counseling to decrease risk of preventable diseases and screening for fall risk and other medical concerns.    This visit is provided free of charge (no copay) for all Medicare recipients. The clinical pharmacists at [facility] have begun to conduct these Wellness Visits which will also include a thorough review of all your medications.    As you primary medical provider recommend that you make an appointment for your Annual Wellness Visit if you have not done so already this year.  You may set up this appointment before you leave today or you may call back ([phone]) and schedule an appointment.  Please make sure when you call that you mention that you are scheduling your Annual Wellness Visit with the clinical pharmacist so that the appointment may be made for the proper length of time.     Continue current medications. Continue good therapeutic lifestyle changes which include good diet and exercise. Fall precautions discussed with patient. If an FOBT was given today- please return it to our front  desk. If you are over 85 years old - you may need Prevnar 105 or the adult Pneumonia vaccine.  **Flu shots are available--- please call and schedule a FLU-CLINIC appointment**  After your visit with [place] today you will receive a survey in the mail or online from [employer] regarding your care with [place]. Please take a moment to fill this out. Your feedback is very important to [place] as you can help [place] better understand your patient needs as well as improve your experience and satisfaction. WE CARE ABOUT YOU!!!   Continue with healthy eating habits and physical activity Get your routine maintenance health treatments and stay on top of these including mammograms and pelvic exams eye exams and colonoscopies.  Your next colonoscopy will not be due until April 2022.    Arrie Senate MD

## 2017-06-21 NOTE — Patient Instructions (Addendum)
Medicare Annual Wellness Visit  [facility] and the medical providers at [facility] strive to bring you the best medical care.  In doing so we not only want to address your current medical conditions and concerns but also to detect new conditions early and prevent illness, disease and health-related problems.    Medicare offers a yearly Wellness Visit which allows our clinical staff to assess your need for preventative services including immunizations, lifestyle education, counseling to decrease risk of preventable diseases and screening for fall risk and other medical concerns.    This visit is provided free of charge (no copay) for all Medicare recipients. The clinical pharmacists at [facility] have begun to conduct these Wellness Visits which will also include a thorough review of all your medications.    As you primary medical provider recommend that you make an appointment for your Annual Wellness Visit if you have not done so already this year.  You may set up this appointment before you leave today or you may call back ([phone]) and schedule an appointment.  Please make sure when you call that you mention that you are scheduling your Annual Wellness Visit with the clinical pharmacist so that the appointment may be made for the proper length of time.     Continue current medications. Continue good therapeutic lifestyle changes which include good diet and exercise. Fall precautions discussed with patient. If an FOBT was given today- please return it to our front desk. If you are over 8 years old - you may need Prevnar 59 or the adult Pneumonia vaccine.  **Flu shots are available--- please call and schedule a FLU-CLINIC appointment**  After your visit with [place] today you will receive a survey in the mail or online from [employer] regarding your care with [place]. Please take a moment to fill this out. Your feedback is very  important to [place] as you can help [place] better understand your patient needs as well as improve your experience and satisfaction. WE CARE ABOUT YOU!!!   Continue with healthy eating habits and physical activity Get your routine maintenance health treatments and stay on top of these including mammograms and pelvic exams eye exams and colonoscopies.  Your next colonoscopy will not be due until April 2022.

## 2017-06-22 LAB — BMP8+EGFR
BUN / CREAT RATIO: 22 (ref 12–28)
BUN: 19 mg/dL (ref 8–27)
CO2: 22 mmol/L (ref 20–29)
CREATININE: 0.87 mg/dL (ref 0.57–1.00)
Calcium: 9.2 mg/dL (ref 8.7–10.3)
Chloride: 106 mmol/L (ref 96–106)
GFR calc non Af Amer: 67 mL/min/{1.73_m2} (ref >59)
GFR, EST AFRICAN AMERICAN: 77 mL/min/{1.73_m2} (ref >59)
Glucose: 90 mg/dL (ref 65–99)
Potassium: 4.6 mmol/L (ref 3.5–5.2)
Sodium: 143 mmol/L (ref 134–144)

## 2017-06-22 LAB — CBC WITH DIFFERENTIAL/PLATELET
Basophils Absolute: 0 10*3/uL (ref 0.0–0.2)
Basos: 0 %
EOS (ABSOLUTE): 0.4 10*3/uL (ref 0.0–0.4)
EOS: 6 %
HEMATOCRIT: 42.8 % (ref 34.0–46.6)
HEMOGLOBIN: 13.9 g/dL (ref 11.1–15.9)
Immature Grans (Abs): 0 10*3/uL (ref 0.0–0.1)
Immature Granulocytes: 1 %
LYMPHS ABS: 1.9 10*3/uL (ref 0.7–3.1)
Lymphs: 31 %
MCH: 29.5 pg (ref 26.6–33.0)
MCHC: 32.5 g/dL (ref 31.5–35.7)
MCV: 91 fL (ref 79–97)
MONOCYTES: 10 %
Monocytes Absolute: 0.7 10*3/uL (ref 0.1–0.9)
NEUTROS ABS: 3.3 10*3/uL (ref 1.4–7.0)
Neutrophils: 52 %
Platelets: 326 10*3/uL (ref 150–379)
RBC: 4.71 x10E6/uL (ref 3.77–5.28)
RDW: 14 % (ref 12.3–15.4)
WBC: 6.3 10*3/uL (ref 3.4–10.8)

## 2017-06-22 LAB — LIPID PANEL
CHOL/HDL RATIO: 4.3 ratio (ref 0.0–4.4)
Cholesterol, Total: 213 mg/dL — ABNORMAL HIGH (ref 100–199)
HDL: 49 mg/dL (ref >39)
LDL CALC: 132 mg/dL — AB (ref 0–99)
TRIGLYCERIDES: 158 mg/dL — AB (ref 0–149)
VLDL CHOLESTEROL CAL: 32 mg/dL (ref 5–40)

## 2017-06-22 LAB — HEPATIC FUNCTION PANEL
ALK PHOS: 115 IU/L (ref 39–117)
ALT: 21 IU/L (ref 0–32)
AST: 25 IU/L (ref 0–40)
Albumin: 4.3 g/dL (ref 3.5–4.8)
Bilirubin Total: 0.4 mg/dL (ref 0.0–1.2)
Bilirubin, Direct: 0.1 mg/dL (ref 0.00–0.40)
TOTAL PROTEIN: 6.7 g/dL (ref 6.0–8.5)

## 2017-06-22 LAB — THYROID PANEL WITH TSH
Free Thyroxine Index: 2 (ref 1.2–4.9)
T3 Uptake Ratio: 25 % (ref 24–39)
T4 TOTAL: 8.1 ug/dL (ref 4.5–12.0)
TSH: 1.97 u[IU]/mL (ref 0.450–4.500)

## 2017-06-22 LAB — VITAMIN D 25 HYDROXY (VIT D DEFICIENCY, FRACTURES): Vit D, 25-Hydroxy: 81.8 ng/mL (ref 30.0–100.0)

## 2017-06-24 ENCOUNTER — Other Ambulatory Visit: Payer: Self-pay | Admitting: *Deleted

## 2017-06-24 MED ORDER — LEVOTHYROXINE SODIUM 50 MCG PO TABS: 50.0000 ug | ORAL_TABLET | Freq: Every day | ORAL | 3 refills | Status: DC

## 2017-06-25 LAB — FECAL OCCULT BLOOD, IMMUNOCHEMICAL: FECAL OCCULT BLD: NEGATIVE

## 2017-06-28 ENCOUNTER — Ambulatory Visit: Payer: Medicare HMO | Admitting: Nurse Practitioner

## 2017-07-03 ENCOUNTER — Telehealth: Payer: Self-pay | Admitting: Family Medicine

## 2017-07-03 MED ORDER — LEVOTHYROXINE SODIUM 50 MCG PO TABS: 50.0000 ug | ORAL_TABLET | Freq: Every day | ORAL | 3 refills | Status: DC

## 2017-07-03 NOTE — Telephone Encounter (Signed)
Rx was sent to the wrong pharmacy- sent to correct pharmacy.

## 2017-07-04 ENCOUNTER — Other Ambulatory Visit: Payer: Self-pay | Admitting: Family Medicine

## 2017-07-31 ENCOUNTER — Ambulatory Visit
Admission: RE | Admit: 2017-07-31 | Discharge: 2017-07-31 | Disposition: A | Discharge status: Home / Self Care | Payer: Medicare HMO | Source: Ambulatory Visit | Attending: Family Medicine | Admitting: Family Medicine

## 2017-07-31 DIAGNOSIS — Z1231 Encounter for screening mammogram for malignant neoplasm of breast: Secondary | ICD-10-CM

## 2017-08-22 ENCOUNTER — Ambulatory Visit: Payer: Medicare HMO | Admitting: Certified Nurse Midwife

## 2017-09-03 ENCOUNTER — Other Ambulatory Visit: Payer: Self-pay

## 2017-09-03 ENCOUNTER — Encounter: Payer: Self-pay | Admitting: Certified Nurse Midwife

## 2017-09-03 ENCOUNTER — Ambulatory Visit (INDEPENDENT_AMBULATORY_CARE_PROVIDER_SITE_OTHER): Payer: Medicare HMO | Admitting: Certified Nurse Midwife

## 2017-09-03 ENCOUNTER — Other Ambulatory Visit (HOSPITAL_COMMUNITY)
Admission: RE | Admit: 2017-09-03 | Discharge: 2017-09-03 | Disposition: A | Discharge status: Home / Self Care | Payer: Medicare HMO | Source: Ambulatory Visit | Attending: Certified Nurse Midwife | Admitting: Certified Nurse Midwife

## 2017-09-03 VITALS — BP 120/70 | HR 70 | Resp 16 | Ht 61.75 in | Wt 151.0 lb

## 2017-09-03 DIAGNOSIS — Z01419 Encounter for gynecological examination (general) (routine) without abnormal findings: Secondary | ICD-10-CM | POA: Diagnosis not present

## 2017-09-03 DIAGNOSIS — Z124 Encounter for screening for malignant neoplasm of cervix: Secondary | ICD-10-CM

## 2017-09-03 DIAGNOSIS — Z780 Asymptomatic menopausal state: Secondary | ICD-10-CM | POA: Diagnosis not present

## 2017-09-03 NOTE — Patient Instructions (Signed)

## 2017-09-03 NOTE — Progress Notes (Signed)
72 y.o. G32P1001 Married  Caucasian Fe here for annual exam. Menopausal no symptoms. Denies vaginal dryness or vaginal bleeding. Sees PCP for aex, labs and cholesterol and hypothyroid management. All stable per patient. No health issues today. Staying busy working on [employer]!   Patient's last menstrual period was 10/08/2007 (exact date).          Sexually active: Yes.    The current method of family planning is tubal ligation.    Exercising: No.  exercise Smoker:  no  Health Maintenance: Pap:  06-17-14 neg History of Abnormal Pap: ? Was told she had dysplasia, all negative MMG:  07-31-17 category b density birads 1:neg Self Breast exams: yes Colonoscopy:  2012 f/u 70yrs BMD:   2010 TDaP:  2015 Shingles: 2016 Pneumonia: 2015 Hep C and HIV: hep c neg 2017 Labs: no   reports that she quit smoking about 39 years ago. She has never used smokeless tobacco. She reports that she does not drink alcohol or use drugs.  Past Medical History:  Diagnosis Date  . Allergy    seasonal  . Arthritis   . Hyperlipidemia   . Osteopenia   . Thyroid disease    hypothyroidism    Past Surgical History:  Procedure Laterality Date  . COLONOSCOPY  07/13/10   small polyp follow in 10 years  . history of iud use  in her 36's  . POLYPECTOMY    . SHOULDER ADHESION RELEASE Left 01/2001  . TUBAL LIGATION      Current Outpatient Medications  Medication Sig Dispense Refill  . Calcium Carbonate-Vitamin D (CALTRATE 600+D) 600-400 MG-UNIT tablet Take 1 tablet by mouth daily.    . Cholecalciferol (VITAMIN D PO) Take 500 Int'l Units by mouth. 6 days a week    . levothyroxine (SYNTHROID, LEVOTHROID) 50 MCG tablet TAKE 1 TABLET DAILY 90 tablet 3  . Red Yeast Rice Extract (RED YEAST RICE PO) Take by mouth.     No current facility-administered medications for this visit.     Family History  Problem Relation Age of Onset  . Cancer Mother        breast  . Breast cancer Mother   . Emphysema Father      ROS:  Pertinent items are noted in HPI.  Otherwise, a comprehensive ROS was negative.  Exam:   BP 120/70   Pulse 70   Resp 16   Ht 5' 1.75" (1.568 m)   Wt 151 lb (68.5 kg)   LMP 10/08/2007 (Exact Date)   BMI 27.84 kg/m  Height: 5' 1.75" (156.8 cm) Ht Readings from Last 3 Encounters:  09/03/17 5' 1.75" (1.568 m)  06/21/17 5' 2.5" (1.588 m)  06/26/16 5' 2.5" (1.588 m)    General appearance: alert, cooperative and appears stated age Head: Normocephalic, without obvious abnormality, atraumatic Neck: no adenopathy, supple, symmetrical, trachea midline and thyroid normal to inspection and palpation Lungs: clear to auscultation bilaterally Breasts: normal appearance, no masses or tenderness, No nipple retraction or dimpling, No nipple discharge or bleeding, No axillary or supraclavicular adenopathy Heart: regular rate and rhythm Abdomen: soft, non-tender; no masses,  no organomegaly Extremities: extremities normal, atraumatic, no cyanosis or edema Skin: Skin color, texture, turgor normal. No rashes or lesions Lymph nodes: Cervical, supraclavicular, and axillary nodes normal. No abnormal inguinal nodes palpated Neurologic: Grossly normal   Pelvic: External genitalia:  no lesions              Urethra:  normal appearing urethra with no  masses, tenderness or lesions              Bartholin's and Skene's: normal                 Vagina: normal appearing vagina with normal color and discharge, no lesions              Cervix: no bleeding following Pap, no cervical motion tenderness and no lesions              Pap taken: Yes.   Bimanual Exam:  Uterus:  normal size, contour, position, consistency, mobility, non-tender and anteverted              Adnexa: normal adnexa and no mass, fullness, tenderness               Rectovaginal: Confirms               Anus:  normal sphincter tone, no lesions  Chaperone present: yes  A:  Well Woman with normal exam  Post Menopausal no vaginal  issues  Hypothyroid/Cholesterol elevation/Osteopenia with MD management  Family history of breast cancer mother   P:   Reviewed health and wellness pertinent to exam  Discussed importance of advising if vaginal bleeding.  Stressed importance of MD follow up for other health issues.  Stressed yearly mammogram and SBE importance  Pap smear: yes   counseled on breast self exam, mammography screening, feminine hygiene, adequate intake of calcium and vitamin D, diet and exercise  return annually or prn  An After Visit Summary was printed and given to the patient.

## 2017-09-04 LAB — CYTOLOGY - PAP
Diagnosis: NEGATIVE
HPV: NOT DETECTED

## 2017-09-07 IMAGING — DX DG CHEST 2V
2 series · 2 of 2 positions shown · non-contrast
Comparison: 06/14/2014

CLINICAL DATA: Hypercholesterolemia.  Vitamin-D deficiency.

EXAM:
CHEST  2 VIEW

[chest pa]
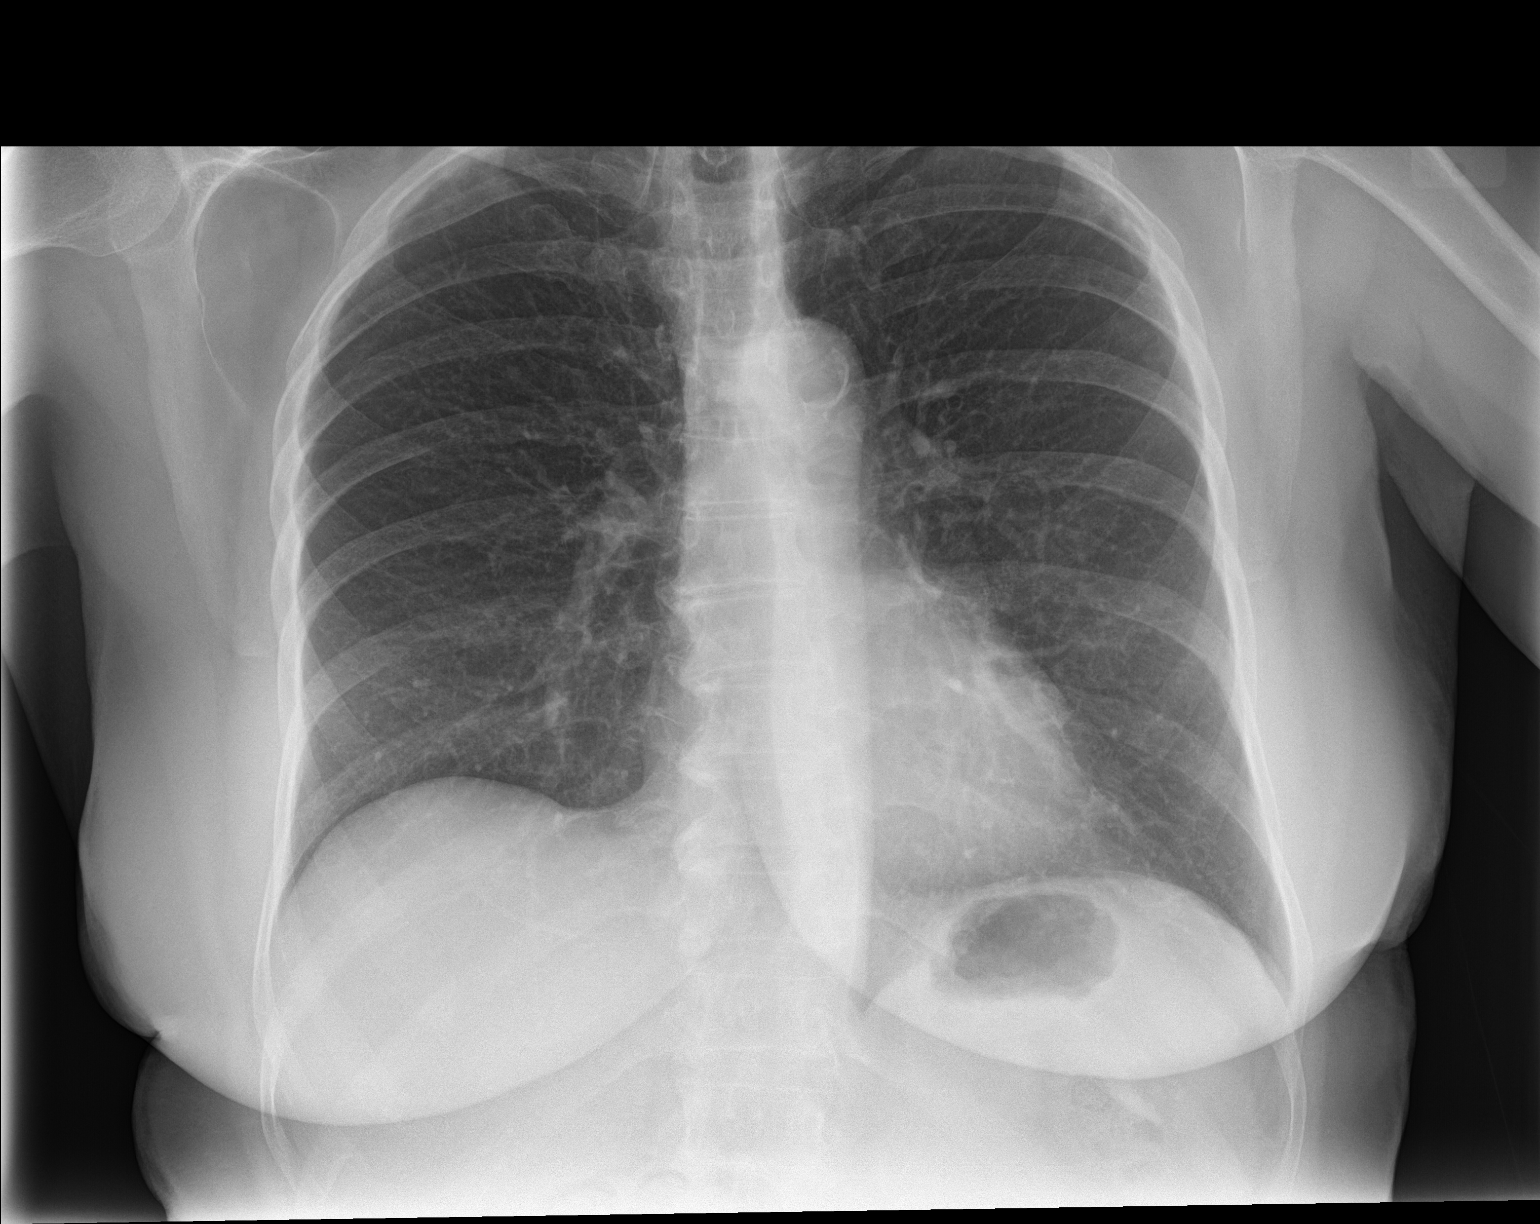

[chest lat]
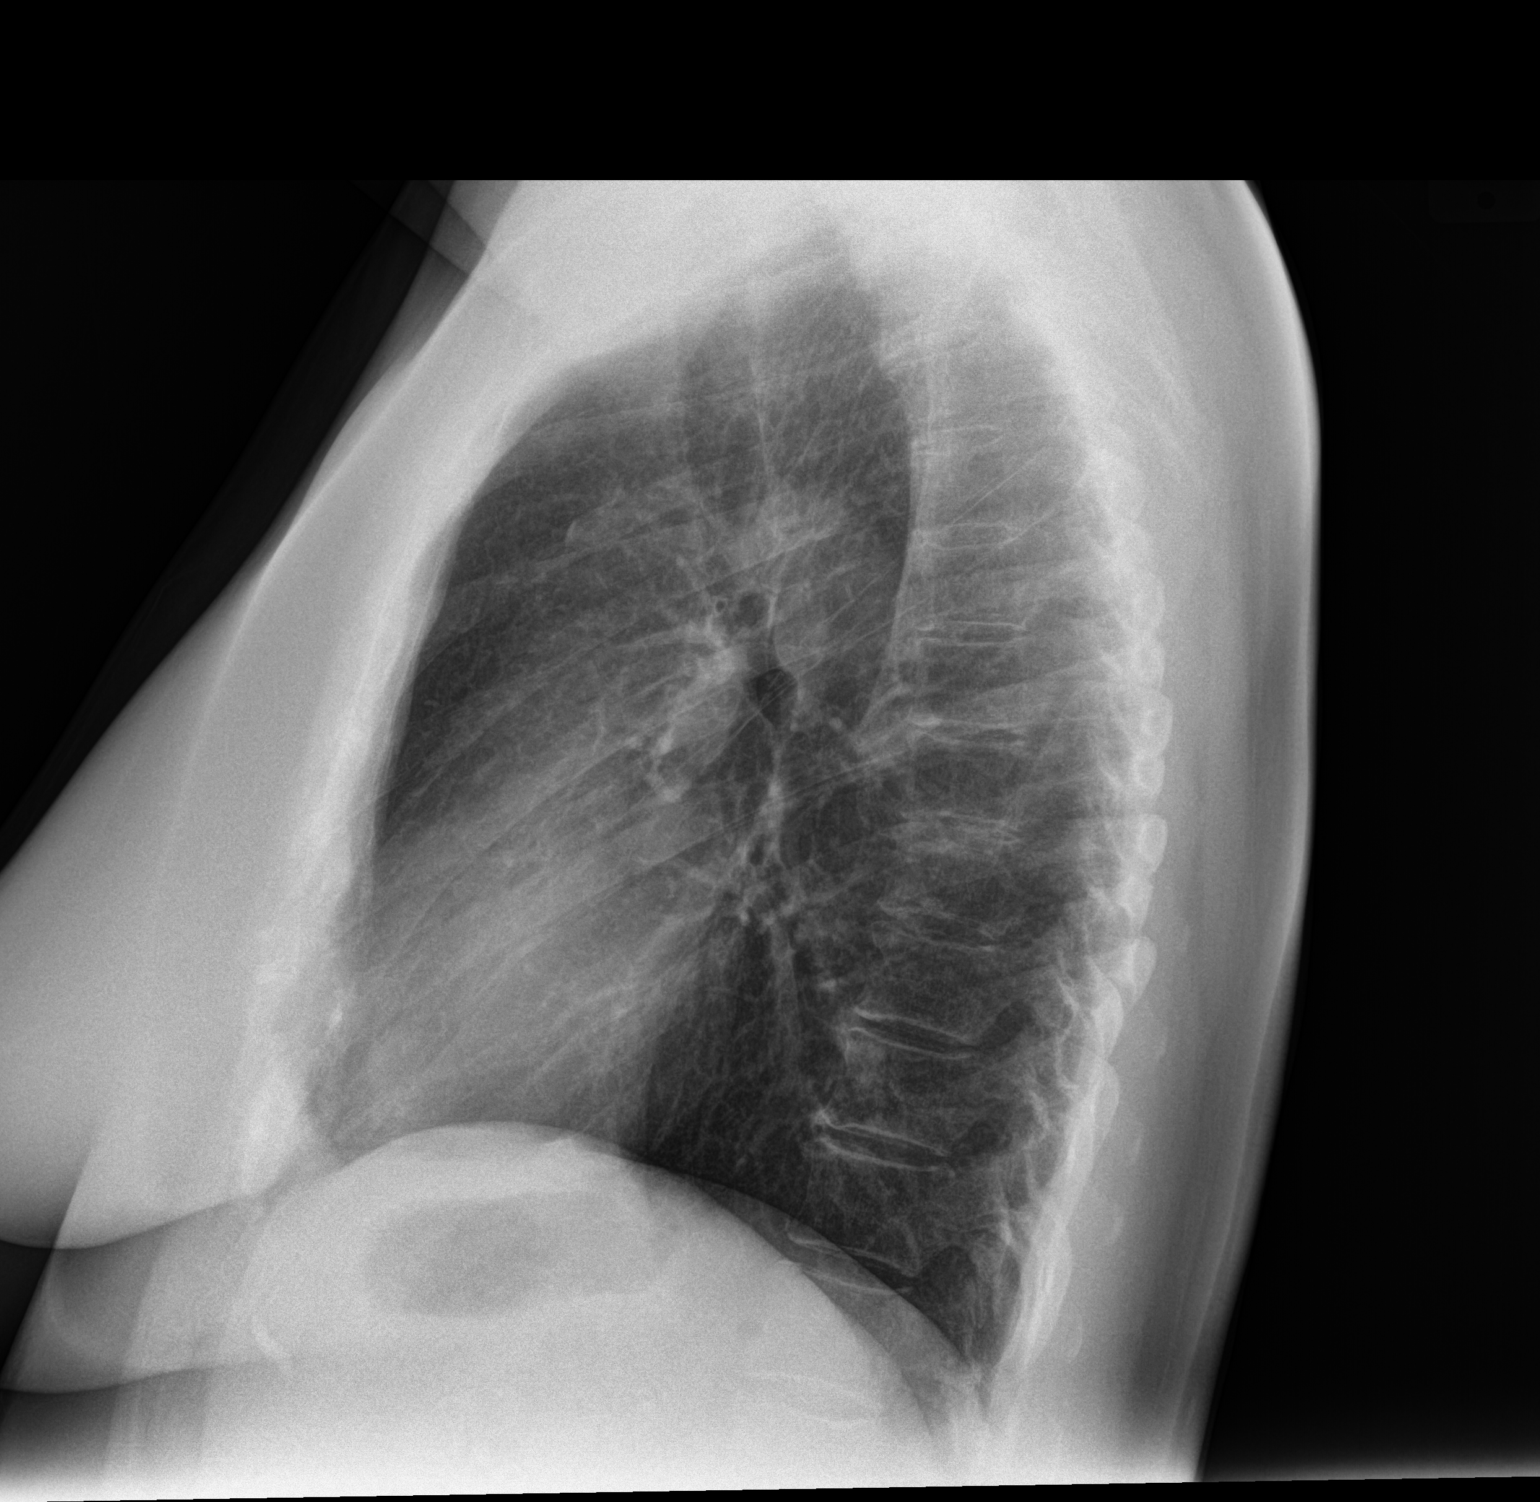

[2 of 2 positions shown; findings below may reference images not displayed]

FINDINGS: Mediastinum and hilar structures are normal. Lungs are clear. No
pleural effusion or pneumothorax. Degenerative changes thoracic
spine. Biapical pleural-parenchymal thickening noted consistent
scarring.
IMPRESSION: Biapical pleural-parenchymal thickening noted consistent with
scarring. No acute pulmonary infiltrate.

## 2018-01-01 DIAGNOSIS — R69 Illness, unspecified: Secondary | ICD-10-CM | POA: Diagnosis not present

## 2018-06-23 ENCOUNTER — Ambulatory Visit: Payer: Medicare HMO | Admitting: Family Medicine

## 2018-08-04 ENCOUNTER — Other Ambulatory Visit: Payer: Self-pay

## 2018-08-04 ENCOUNTER — Ambulatory Visit (INDEPENDENT_AMBULATORY_CARE_PROVIDER_SITE_OTHER): Payer: Medicare HMO | Admitting: Family Medicine

## 2018-08-04 DIAGNOSIS — Z1211 Encounter for screening for malignant neoplasm of colon: Secondary | ICD-10-CM

## 2018-08-04 DIAGNOSIS — E782 Mixed hyperlipidemia: Secondary | ICD-10-CM

## 2018-08-04 DIAGNOSIS — Z803 Family history of malignant neoplasm of breast: Secondary | ICD-10-CM

## 2018-08-04 DIAGNOSIS — E039 Hypothyroidism, unspecified: Secondary | ICD-10-CM | POA: Diagnosis not present

## 2018-08-04 DIAGNOSIS — Z0000 Encounter for general adult medical examination without abnormal findings: Secondary | ICD-10-CM

## 2018-08-04 DIAGNOSIS — E559 Vitamin D deficiency, unspecified: Secondary | ICD-10-CM

## 2018-08-04 NOTE — Addendum Note (Signed)
Addended by: Zannie Cove on: 08/04/2018 11:44 AM   Modules accepted: Orders

## 2018-08-04 NOTE — Progress Notes (Signed)
Virtual Visit Via telephone Note I connected with@ on 08/04/18 by telephone and verified that I am speaking with the correct person or authorized healthcare agent using two identifiers. Anne Morris is currently located at home and there are no unauthorized people in close proximity. I completed this visit while in a private location in my home .  I connected to the patient by telephone and verified that I was speaking with the correct person.  This visit type was conducted due to national recommendations for restrictions regarding the COVID-19 Pandemic (e.g. social distancing).  This format is felt to be most appropriate for this patient at this time.  All issues noted in this document were discussed and addressed.  No physical exam was performed.    I discussed the limitations, risks, security and privacy concerns of performing an evaluation and management service by telephone and the availability of in person appointments. I also discussed with the patient that there may be a patient responsible charge related to this service. The patient expressed understanding and agreed to proceed.   Date:  08/04/2018    ID:  Anne Morris      [birth year]        [id number]   Patient Care Team Patient Care Team: Chipper Herb, MD as PCP - General (Family Medicine)  Reason for Visit: Primary Care Follow-up     History of Present Illness & Review of Systems:     Anne Morris is a 73 y.o. year old female primary care patient that presents today for a telehealth visit.  Patient is doing well overall and is up-to-date on her mammograms and pelvic exams.  She is due for mammogram currently and with coronavirus she understands that this may be delayed somewhat.  She gets this at [facility] office.  Today she denies any chest pain pressure tightness or shortness of breath anymore than usual.  She is active physically.  She denies any trouble with swallowing  indigestion nausea vomiting diarrhea blood in  the stool black tarry bowel movements or change in bowel habits.  She does have occasional heartburn and it is infrequent and is due to her dietary habits.  We did discuss Pepcid AC if necessary and needed.  She is passing her water well.  There is not been any change in family history and her mother died of breast cancer when she was 73 years old.  She does get her mammograms and pelvic exams regularly.  She has had trouble with fever blisters and has this very infrequently.  She is taking some valacyclovir currently which is out of date and understands if she needs more she will call the drugstore in [address] and have them to call us and we will refill this rarely use medication.  Review of systems as stated otherwise negative for body systems left unmentioned.   The patient does not have symptoms concerning for COVID-19 infection (fever, chills, cough, or new shortness of breath).      Current Medications (Verified) Allergies as of 08/04/2018      Reactions   Erythromycin Nausea Only      Medication List       Accurate as of Aug 04, 2018  8:10 AM. Always use your most recent med list.        Caltrate 600+D 600-400 MG-UNIT tablet Generic drug:  Calcium Carbonate-Vitamin D Take 1 tablet by mouth daily.   levothyroxine 50 MCG tablet Commonly known as:  SYNTHROID  TAKE 1 TABLET DAILY   RED YEAST RICE PO Take by mouth.   VITAMIN D PO Take 500 Int'l Units by mouth. 6 days a week           Allergies (Verified)    Erythromycin  Past Medical History Past Medical History:  Diagnosis Date  . Abnormal Pap smear of cervix    ?  Marland Kitchen Allergy    seasonal  . Arthritis   . Hyperlipidemia   . Osteopenia   . Thyroid disease    hypothyroidism     Past Surgical History:  Procedure Laterality Date  . COLONOSCOPY  07/13/10   small polyp follow in 10 years  . history of iud use  in her 50's  . POLYPECTOMY    . SHOULDER ADHESION RELEASE Left 01/2001  . TUBAL LIGATION      Social  History   Socioeconomic History  . Marital status: Married    Spouse name: Not on file  . Number of children: Not on file  . Years of education: Not on file  . Highest education level: Not on file  Occupational History  . Not on file  Social Needs  . Financial resource strain: Not on file  . Food insecurity:    Worry: Not on file    Inability: Not on file  . Transportation needs:    Medical: Not on file    Non-medical: Not on file  Tobacco Use  . Smoking status: Former Smoker    Last attempt to quit: 04/02/1978    Years since quitting: 40.3  . Smokeless tobacco: Never Used  Substance and Sexual Activity  . Alcohol use: No    Alcohol/week: 0.0 standard drinks  . Drug use: No  . Sexual activity: Yes    Partners: Male    Birth control/protection: Surgical    Comment: BTSP  Lifestyle  . Physical activity:    Days per week: Not on file    Minutes per session: Not on file  . Stress: Not on file  Relationships  . Social connections:    Talks on phone: Not on file    Gets together: Not on file    Attends religious service: Not on file    Active member of club or organization: Not on file    Attends meetings of clubs or organizations: Not on file    Relationship status: Not on file  Other Topics Concern  . Not on file  Social History Narrative  . Not on file     Family History  Problem Relation Age of Onset  . Cancer Mother        breast  . Breast cancer Mother   . Emphysema Father       Labs/Other Tests and Data Reviewed:    Wt Readings from Last 3 Encounters:  09/03/17 151 lb (68.5 kg)  06/21/17 149 lb (67.6 kg)  06/26/16 147 lb (66.7 kg)   Temp Readings from Last 3 Encounters:  06/21/17 97.6 F (36.4 C) (Oral)  06/20/16 97.3 F (36.3 C) (Oral)  10/31/15 97.6 F (36.4 C) (Oral)   BP Readings from Last 3 Encounters:  09/03/17 120/70  06/21/17 129/66  06/26/16 124/66   Pulse Readings from Last 3 Encounters:  09/03/17 70  06/21/17 63  06/26/16 64      No results found for: HGBA1C Lab Results  Component Value Date   LDLCALC 132 (H) 06/21/2017   CREATININE 0.87 06/21/2017       Chemistry  Component Value Date/Time   NA 143 06/21/2017 0930   K 4.6 06/21/2017 0930   CL 106 06/21/2017 0930   CO2 22 06/21/2017 0930   BUN 19 06/21/2017 0930   CREATININE 0.87 06/21/2017 0930      Component Value Date/Time   CALCIUM 9.2 06/21/2017 0930   ALKPHOS 115 06/21/2017 0930   AST 25 06/21/2017 0930   ALT 21 06/21/2017 0930   BILITOT 0.4 06/21/2017 0930         OBSERVATIONS/ OBJECTIVE:     The patient is alert and cooperative and is feeling well without any complaints.  She does not have any weights available but says that her weight is up and she needs to get this down some.  She does not check home blood pressure readings.  She does try to keep up with her regularly required medical evaluation including labs and mammogram and pelvic exam.  She is due to get lab work chest x-ray urinalysis and an FOBT.  We will put in a future order for this to be done in the next 4 to 6 weeks.  She will call the office and make sure that it is a convenient time for her to come in prior to getting that done.  Physical exam deferred due to nature of telephonic visit.  ASSESSMENT & PLAN    Time:   Today, I have spent 23 minutes with the patient via telephone discussing the above including Covid precautions.     Visit Diagnoses: 1. Mixed hyperlipidemia -Continue with aggressive therapeutic lifestyle changes and red yeast rice.  Coming get lab work as planned in 4 to 6 weeks  2. Vitamin D deficiency -Continue vitamin D replacement pending results of lab work  3. Hypothyroidism, unspecified type -Continue thyroid replacement pending results of lab work  4.  Family history breast cancer -Continue to get mammogram yearly  5.  Heartburn -Take Pepcid AC as needed and watch diet more closely   The patient will come in in the next 4 to 6  weeks and get a urinalysis and FOBT a chest x-ray and lab work to include thyroid CBC BMP LFTs and vitamin D.  The above assessment and management plan was discussed with the patient. The patient verbalized understanding of and has agreed to the management plan. Patient is aware to call the clinic if symptoms persist or worsen. Patient is aware when to return to the clinic for a follow-up visit. Patient educated on when it is appropriate to go to the emergency department.    Chipper Herb, MD [facility] [address], [place], [place] [address] Ph [phone]   Arrie Senate MD

## 2018-08-04 NOTE — Patient Instructions (Signed)
The patient should come by the office and get lab work including a urinalysis and FOBT She needs to be scheduled for a mammogram because of family history of breast cancer She needs to get her pelvic exam at least every 2 years She should continue to practice good hand and respiratory hygiene She should drink plenty of fluids and stay well-hydrated She should stay active physically but also being careful not to put herself at risk for falling avoid climbing, take her vitamin D and calcium and walk regularly.

## 2018-08-04 NOTE — Addendum Note (Signed)
Addended by: Zannie Cove on: 08/04/2018 11:46 AM   Modules accepted: Orders

## 2018-08-27 ENCOUNTER — Telehealth: Payer: Self-pay | Admitting: Certified Nurse Midwife

## 2018-08-27 NOTE — Telephone Encounter (Signed)
Left message on voicemail to call and reschedule cancelled appointment. °

## 2018-09-05 ENCOUNTER — Ambulatory Visit: Payer: Medicare HMO | Admitting: Certified Nurse Midwife

## 2018-09-09 DIAGNOSIS — R69 Illness, unspecified: Secondary | ICD-10-CM | POA: Diagnosis not present

## 2018-10-07 ENCOUNTER — Other Ambulatory Visit: Payer: Self-pay | Admitting: Family Medicine

## 2018-10-07 ENCOUNTER — Encounter: Payer: Self-pay | Admitting: Family Medicine

## 2018-10-07 ENCOUNTER — Other Ambulatory Visit: Payer: Medicare HMO

## 2018-10-07 ENCOUNTER — Other Ambulatory Visit: Payer: Self-pay

## 2018-10-07 DIAGNOSIS — E782 Mixed hyperlipidemia: Secondary | ICD-10-CM | POA: Diagnosis not present

## 2018-10-07 DIAGNOSIS — E039 Hypothyroidism, unspecified: Secondary | ICD-10-CM

## 2018-10-07 DIAGNOSIS — E559 Vitamin D deficiency, unspecified: Secondary | ICD-10-CM

## 2018-10-07 MED ORDER — LEVOTHYROXINE SODIUM 50 MCG PO TABS: 50.0000 ug | ORAL_TABLET | Freq: Every day | ORAL | 0 refills | Status: DC

## 2018-10-08 LAB — BMP8+EGFR
BUN/Creatinine Ratio: 21 (ref 12–28)
BUN: 17 mg/dL (ref 8–27)
CO2: 20 mmol/L (ref 20–29)
Calcium: 9.1 mg/dL (ref 8.7–10.3)
Chloride: 108 mmol/L — ABNORMAL HIGH (ref 96–106)
Creatinine, Ser: 0.81 mg/dL (ref 0.57–1.00)
GFR calc Af Amer: 83 mL/min/{1.73_m2} (ref >59)
GFR calc non Af Amer: 72 mL/min/{1.73_m2} (ref >59)
Glucose: 93 mg/dL (ref 65–99)
Potassium: 4.6 mmol/L (ref 3.5–5.2)
Sodium: 143 mmol/L (ref 134–144)

## 2018-10-08 LAB — HEPATIC FUNCTION PANEL
ALT: 21 IU/L (ref 0–32)
AST: 24 IU/L (ref 0–40)
Albumin: 4 g/dL (ref 3.7–4.7)
Alkaline Phosphatase: 104 IU/L (ref 39–117)
Bilirubin Total: 0.4 mg/dL (ref 0.0–1.2)
Bilirubin, Direct: 0.11 mg/dL (ref 0.00–0.40)
Total Protein: 6.3 g/dL (ref 6.0–8.5)

## 2018-10-08 LAB — CBC WITH DIFFERENTIAL/PLATELET
Basophils Absolute: 0 10*3/uL (ref 0.0–0.2)
Basos: 0 %
EOS (ABSOLUTE): 0.2 10*3/uL (ref 0.0–0.4)
Eos: 3 %
Hematocrit: 42.5 % (ref 34.0–46.6)
Hemoglobin: 14.2 g/dL (ref 11.1–15.9)
Immature Grans (Abs): 0 10*3/uL (ref 0.0–0.1)
Immature Granulocytes: 1 %
Lymphocytes Absolute: 2.4 10*3/uL (ref 0.7–3.1)
Lymphs: 36 %
MCH: 29.8 pg (ref 26.6–33.0)
MCHC: 33.4 g/dL (ref 31.5–35.7)
MCV: 89 fL (ref 79–97)
Monocytes Absolute: 0.6 10*3/uL (ref 0.1–0.9)
Monocytes: 10 %
Neutrophils Absolute: 3.4 10*3/uL (ref 1.4–7.0)
Neutrophils: 50 %
Platelets: 293 10*3/uL (ref 150–450)
RBC: 4.77 x10E6/uL (ref 3.77–5.28)
RDW: 12.8 % (ref 11.7–15.4)
WBC: 6.7 10*3/uL (ref 3.4–10.8)

## 2018-10-08 LAB — VITAMIN D 25 HYDROXY (VIT D DEFICIENCY, FRACTURES): Vit D, 25-Hydroxy: 51.4 ng/mL (ref 30.0–100.0)

## 2018-10-08 LAB — THYROID PANEL WITH TSH
Free Thyroxine Index: 1.7 (ref 1.2–4.9)
T3 Uptake Ratio: 23 % — ABNORMAL LOW (ref 24–39)
T4, Total: 7.4 ug/dL (ref 4.5–12.0)
TSH: 3.82 u[IU]/mL (ref 0.450–4.500)

## 2018-10-08 LAB — LIPID PANEL
Chol/HDL Ratio: 4.8 ratio — ABNORMAL HIGH (ref 0.0–4.4)
Cholesterol, Total: 209 mg/dL — ABNORMAL HIGH (ref 100–199)
HDL: 44 mg/dL (ref >39)
LDL Calculated: 116 mg/dL — ABNORMAL HIGH (ref 0–99)
Triglycerides: 245 mg/dL — ABNORMAL HIGH (ref 0–149)
VLDL Cholesterol Cal: 49 mg/dL — ABNORMAL HIGH (ref 5–40)

## 2018-10-08 MED ORDER — ATORVASTATIN CALCIUM 20 MG PO TABS: 20.0000 mg | ORAL_TABLET | Freq: Every day | ORAL | 3 refills | Status: DC

## 2018-10-08 MED ORDER — LEVOTHYROXINE SODIUM 50 MCG PO TABS: 50.0000 ug | ORAL_TABLET | Freq: Every day | ORAL | 1 refills | Status: DC

## 2018-10-09 ENCOUNTER — Encounter: Payer: Self-pay | Admitting: Family Medicine

## 2018-10-16 DIAGNOSIS — Z1283 Encounter for screening for malignant neoplasm of skin: Secondary | ICD-10-CM | POA: Diagnosis not present

## 2018-10-16 DIAGNOSIS — Z85828 Personal history of other malignant neoplasm of skin: Secondary | ICD-10-CM | POA: Diagnosis not present

## 2018-10-16 DIAGNOSIS — L304 Erythema intertrigo: Secondary | ICD-10-CM | POA: Diagnosis not present

## 2018-10-16 DIAGNOSIS — L820 Inflamed seborrheic keratosis: Secondary | ICD-10-CM | POA: Diagnosis not present

## 2018-10-16 DIAGNOSIS — Z08 Encounter for follow-up examination after completed treatment for malignant neoplasm: Secondary | ICD-10-CM | POA: Diagnosis not present

## 2018-10-29 DIAGNOSIS — H5203 Hypermetropia, bilateral: Secondary | ICD-10-CM | POA: Diagnosis not present

## 2018-10-29 DIAGNOSIS — H52229 Regular astigmatism, unspecified eye: Secondary | ICD-10-CM | POA: Diagnosis not present

## 2018-10-29 LAB — HM DIABETES EYE EXAM

## 2019-01-01 DIAGNOSIS — Z0100 Encounter for examination of eyes and vision without abnormal findings: Secondary | ICD-10-CM | POA: Diagnosis not present

## 2019-04-07 DIAGNOSIS — R69 Illness, unspecified: Secondary | ICD-10-CM | POA: Diagnosis not present

## 2019-05-19 ENCOUNTER — Other Ambulatory Visit: Payer: Self-pay | Admitting: Family Medicine

## 2019-05-19 DIAGNOSIS — E039 Hypothyroidism, unspecified: Secondary | ICD-10-CM

## 2019-05-27 ENCOUNTER — Telehealth: Payer: Self-pay | Admitting: Family Medicine

## 2019-05-27 NOTE — Telephone Encounter (Signed)
Patient states she read an article that You practice holistic approach. Told patient I would ask. Please advise

## 2019-05-27 NOTE — Telephone Encounter (Signed)
I am not sure if she practices holistically.

## 2019-05-28 NOTE — Telephone Encounter (Signed)
Called patient, left message to return call.

## 2019-05-28 NOTE — Telephone Encounter (Signed)
Pt returned missed call from [place]. Reviewed Britneys response with patient regarding Holistic practice. Pt happy with response and said she would be calling back to schedule appt to establish care with Britney in near future.

## 2019-05-28 NOTE — Telephone Encounter (Signed)
Yes, I believe in treating the patient based on medical symptoms, mental/emotional and social factors. Patient's are people, not just a collection of their disease processes.

## 2019-06-23 ENCOUNTER — Encounter: Payer: Self-pay | Admitting: Certified Nurse Midwife

## 2019-10-06 ENCOUNTER — Ambulatory Visit (INDEPENDENT_AMBULATORY_CARE_PROVIDER_SITE_OTHER): Payer: Medicare HMO | Admitting: Nurse Practitioner

## 2019-10-06 DIAGNOSIS — J301 Allergic rhinitis due to pollen: Secondary | ICD-10-CM

## 2019-10-06 MED ORDER — FLUTICASONE PROPIONATE 50 MCG/ACT NA SUSP: 2.0000 | Freq: Every day | NASAL | 6 refills | Status: DC

## 2019-10-06 MED ORDER — PREDNISONE 10 MG (21) PO TBPK: ORAL_TABLET | ORAL | 0 refills | Status: DC

## 2019-10-06 NOTE — Progress Notes (Signed)
   Virtual Visit via telephone Note Due to COVID-19 pandemic this visit was conducted virtually. This visit type was conducted due to national recommendations for restrictions regarding the COVID-19 Pandemic (e.g. social distancing, sheltering in place) in an effort to limit this patient's exposure and mitigate transmission in our community. All issues noted in this document were discussed and addressed.  A physical exam was not performed with this format.  I connected with Anne Morris on 10/06/19 at 10:50 by telephone and verified that I am speaking with the correct person using two identifiers. Anne Morris is currently located at home and no one is currently with  her during visit. The provider, Mary-Margaret Hassell Done, FNP is located in their office at time of visit.  I discussed the limitations, risks, security and privacy concerns of performing an evaluation and management service by telephone and the availability of in person appointments. I also discussed with the patient that there may be a patient responsible charge related to this service. The patient expressed understanding and agreed to proceed.   History and Present Illness:   Chief Complaint: uri  HPI  Patient calls in c/o constantt drainage for 3 months. Has productive cough, with clear phlegm. She says her allergies flared up several months ago and she just cannot sem to get better. She has tried some allergy meds.   Review of Systems  Constitutional: Negative for chills and fever.  HENT: Positive for congestion. Negative for sinus pain and sore throat.   Respiratory: Positive for cough (productive).   Neurological: Negative for dizziness and headaches.  All other systems reviewed and are negative.    Observations/Objective: Alert and oriented- answers all questions appropriately No distress Voice hoarse No cough during visit   Assessment and Plan: Anne Morris in today with chief complaint of No chief  complaint on file.   1. Non-seasonal allergic rhinitis due to pollen Force fluids Rest  RTO prn - predniSONE (STERAPRED UNI-PAK 21 TAB) 10 MG (21) TBPK tablet; As directed x 6 days  Dispense: 21 tablet; Refill: 0 - fluticasone (FLONASE) 50 MCG/ACT nasal spray; Place 2 sprays into both nostrils daily.  Dispense: 16 g; Refill: 6     Follow Up Instructions: prn    I discussed the assessment and treatment plan with the patient. The patient was provided an opportunity to ask questions and all were answered. The patient agreed with the plan and demonstrated an understanding of the instructions.   The patient was advised to call back or seek an in-person evaluation if the symptoms worsen or if the condition fails to improve as anticipated.  The above assessment and management plan was discussed with the patient. The patient verbalized understanding of and has agreed to the management plan. Patient is aware to call the clinic if symptoms persist or worsen. Patient is aware when to return to the clinic for a follow-up visit. Patient educated on when it is appropriate to go to the emergency department.   Time call ended:  11:05  I provided 15 minutes of non-face-to-face time during this encounter.    Mary-Margaret Hassell Done, FNP

## 2019-10-12 ENCOUNTER — Encounter: Payer: Self-pay | Admitting: Family Medicine

## 2019-10-12 ENCOUNTER — Ambulatory Visit (INDEPENDENT_AMBULATORY_CARE_PROVIDER_SITE_OTHER): Payer: Medicare HMO | Admitting: Family Medicine

## 2019-10-12 ENCOUNTER — Other Ambulatory Visit: Payer: Self-pay

## 2019-10-12 DIAGNOSIS — J3489 Other specified disorders of nose and nasal sinuses: Secondary | ICD-10-CM | POA: Diagnosis not present

## 2019-10-12 MED ORDER — AMOXICILLIN 500 MG PO CAPS: 500.0000 mg | ORAL_CAPSULE | Freq: Two times a day (BID) | ORAL | 0 refills | Status: DC

## 2019-10-12 MED ORDER — PREDNISONE 20 MG PO TABS: ORAL_TABLET | ORAL | 0 refills | Status: DC

## 2019-10-12 NOTE — Progress Notes (Signed)
   Virtual Visit via telephone Note  I connected with Anne Morris on 10/12/19 at 1237 by telephone and verified that I am speaking with the correct person using two identifiers. Anne Morris is currently located at home and no other people are currently with her during visit. The provider, Fransisca Kaufmann Shaylee Stanislawski, MD is located in their office at time of visit.  Call ended at 1245  I discussed the limitations, risks, security and privacy concerns of performing an evaluation and management service by telephone and the availability of in person appointments. I also discussed with the patient that there may be a patient responsible charge related to this service. The patient expressed understanding and agreed to proceed.   History and Present Illness: Patient was in a lot of pollen 2 months ago and she was trying to let it run its course.  She is having a lot of drainage and it is just not clearing and she did the prednisone taper and it helped the first day and then not after.  She has pressure in ears.  She is using flonase. She gets allergies but is just not getting better.   1. Sinus drainage     Review of Systems  Constitutional: Negative for chills and fever.  HENT: Positive for congestion, postnasal drip, rhinorrhea, sinus pressure, sneezing and sore throat. Negative for ear discharge and ear pain.   Eyes: Negative for pain, redness and visual disturbance.  Respiratory: Positive for cough. Negative for chest tightness and shortness of breath.   Cardiovascular: Negative for chest pain and leg swelling.  Genitourinary: Negative for difficulty urinating and dysuria.  Musculoskeletal: Negative for back pain and gait problem.  Skin: Negative for rash.  Neurological: Negative for light-headedness and headaches.  Psychiatric/Behavioral: Negative for agitation and behavioral problems.  All other systems reviewed and are negative.   Observations/Objective: Patient sounds comfortable and in  no acute distress  Assessment and Plan: Problem List Items Addressed This Visit    None    Visit Diagnoses    Sinus drainage    -  Primary   Relevant Medications   amoxicillin (AMOXIL) 500 MG capsule   predniSONE (DELTASONE) 20 MG tablet      Gave a longer taper of prednisone and amoxicillin and they have instructions to try Flonase and Mucinex along with it and also to take an antihistamine.  Follow up plan: Return if symptoms worsen or fail to improve.   I discussed the assessment and treatment plan with the patient. The patient was provided an opportunity to ask questions and all were answered. The patient agreed with the plan and demonstrated an understanding of the instructions.   The patient was advised to call back or seek an in-person evaluation if the symptoms worsen or if the condition fails to improve as anticipated.  The above assessment and management plan was discussed with the patient. The patient verbalized understanding of and has agreed to the management plan. Patient is aware to call the clinic if symptoms persist or worsen. Patient is aware when to return to the clinic for a follow-up visit. Patient educated on when it is appropriate to go to the emergency department.    I provided 8 minutes of non-face-to-face time during this encounter.    Worthy Rancher, MD

## 2019-10-13 DIAGNOSIS — R69 Illness, unspecified: Secondary | ICD-10-CM | POA: Diagnosis not present

## 2019-10-23 ENCOUNTER — Telehealth: Payer: Self-pay | Admitting: Family Medicine

## 2019-10-23 NOTE — Telephone Encounter (Signed)
Pt had televisit 07.12.2021 and 07.06.2021--pt still has drainage cough no fever. She has finished all antibiotic but still taking prednisone. Please call back

## 2019-10-23 NOTE — Telephone Encounter (Signed)
Pt scheduled in the [facility] 10/26/19 at 4:10.

## 2019-10-26 ENCOUNTER — Ambulatory Visit (INDEPENDENT_AMBULATORY_CARE_PROVIDER_SITE_OTHER): Payer: Medicare HMO | Admitting: Family Medicine

## 2019-10-26 ENCOUNTER — Other Ambulatory Visit: Payer: Self-pay | Admitting: *Deleted

## 2019-10-26 ENCOUNTER — Encounter: Payer: Self-pay | Admitting: Family Medicine

## 2019-10-26 VITALS — BP 134/79 | HR 98 | Temp 98.1°F | Resp 22

## 2019-10-26 DIAGNOSIS — J0100 Acute maxillary sinusitis, unspecified: Secondary | ICD-10-CM

## 2019-10-26 DIAGNOSIS — E039 Hypothyroidism, unspecified: Secondary | ICD-10-CM

## 2019-10-26 MED ORDER — LEVOFLOXACIN 500 MG PO TABS: 500.0000 mg | ORAL_TABLET | Freq: Every day | ORAL | 0 refills | Status: DC

## 2019-10-26 MED ORDER — LEVOTHYROXINE SODIUM 50 MCG PO TABS: 50.0000 ug | ORAL_TABLET | Freq: Every day | ORAL | 0 refills | Status: DC

## 2019-10-26 NOTE — Telephone Encounter (Signed)
Spoke with patient, appointment scheduled with Dr. Lajuana Ripple on 11/16/19 at 10:30 am.

## 2019-10-26 NOTE — Telephone Encounter (Signed)
Last TSH 10/07/18. Giving patient 30 day supply although this is past a year. Pt has appt today with our [place] [facility] Routing to pools to make an appt closer to the end of the 30 day supply

## 2019-10-26 NOTE — Progress Notes (Signed)
Chief Complaint  Patient presents with  . nasal drainage    clear thick mucuos    HPI  Patient presents today for Symptoms include congestion, facial pain, nasal congestion, non productive cough, post nasal drip and sinus pressure. There is no fever, chills, or sweats. Onset of symptoms was a few days ago, gradually worsening since that time.    PMH: Smoking status noted ROS: Per HPI  Objective: BP (!) 134/79   Pulse 98   Temp 98.1 F (36.7 C) (Temporal)   Resp 22   LMP 10/08/2007 (Exact Date)   SpO2 97%  Gen: NAD, alert, cooperative with exam HEENT: NCAT, EOMI, PERRL CV: RRR, good S1/S2, no murmur Resp: CTABL, no wheezes, non-labored Neuro: Alert and oriented, No gross deficits  Assessment and plan:  1. Acute maxillary sinusitis, recurrence not specified     Meds ordered this encounter  Medications  . levofloxacin (LEVAQUIN) 500 MG tablet    Sig: Take 1 tablet (500 mg total) by mouth daily.    Dispense:  10 tablet    Refill:  0    No orders of the defined types were placed in this encounter.   Follow up as needed.  Claretta Fraise, MD

## 2019-10-30 ENCOUNTER — Telehealth: Payer: Self-pay | Admitting: Family Medicine

## 2019-10-30 DIAGNOSIS — J0100 Acute maxillary sinusitis, unspecified: Secondary | ICD-10-CM

## 2019-10-30 NOTE — Telephone Encounter (Signed)
Patient states that she still has a cough and postnasal drip which is making it hard for her to swallow.

## 2019-10-30 NOTE — Telephone Encounter (Signed)
Please refer to ENT ( I cannot do that from my remote access) use the diagnosis from the office visit  Thanks

## 2019-10-30 NOTE — Telephone Encounter (Signed)
Refer placed and patient aware.

## 2019-10-30 NOTE — Telephone Encounter (Signed)
Please ask what her current symptoms are

## 2019-10-30 NOTE — Telephone Encounter (Signed)
Pt called stating that she saw Dr Livia Snellen on 10/26/19 and was told that if she doesn't feel any better in the next few days with the medicine he prescribed her then pt may need to see a specialist. Pt says she doesn't feel any better and wants to see a specialist.

## 2019-10-30 NOTE — Addendum Note (Signed)
Addended by: Nigel Berthold C on: 10/30/2019 02:05 PM   Modules accepted: Orders

## 2019-11-03 ENCOUNTER — Telehealth: Payer: Self-pay | Admitting: Family Medicine

## 2019-11-03 NOTE — Telephone Encounter (Signed)
  REFERRAL REQUEST Telephone Note 11/03/2019  What type of referral do you need? ENT  Why do you need this referral? Coughing, congestion, has been seen several times last time she was seen by Dr. Livia Snellen he told her if she did not get better a referral would be put in to ENT  Have you been seen at our office for this problem? YES (Advise that they may need an appointment with their PCP before a referral can be done)  Is there a particular doctor or location that you prefer? [place]  Patient notified that referrals can take up to a week or longer to process. If they haven't heard anything within a week they should call back and speak with the referral department.

## 2019-11-05 NOTE — Telephone Encounter (Signed)
Pts husband called in today upset about the referral that was placed to ENT.   A referral was placed on Friday for ENT - [facility]  Pt has been sick for >1 mo. She has had 2 tele visits and on 7/26 was able to come in and see Dr Livia Snellen in person.   She was given some great meds to try and knock this out- but it is still bothering her.  While waiting on our referral process- (sent to ent Friday) they have called their insurance plan and found out that they do not even need a referral and they were upset that we did not know this.   I explained to pts husband that this is simply not something that we dont always know and every insurance and every plan are different. Most occasions, referrals are required. With referrals also, all notes are sent from pcp to specialist as well.   He is aware to call the ENT office to schedule ( as they already have all of the info)

## 2019-11-13 DIAGNOSIS — R05 Cough: Secondary | ICD-10-CM | POA: Diagnosis not present

## 2019-11-13 DIAGNOSIS — R1314 Dysphagia, pharyngoesophageal phase: Secondary | ICD-10-CM | POA: Diagnosis not present

## 2019-11-13 DIAGNOSIS — R490 Dysphonia: Secondary | ICD-10-CM | POA: Diagnosis not present

## 2019-11-13 DIAGNOSIS — K219 Gastro-esophageal reflux disease without esophagitis: Secondary | ICD-10-CM | POA: Diagnosis not present

## 2019-11-13 DIAGNOSIS — J3089 Other allergic rhinitis: Secondary | ICD-10-CM | POA: Diagnosis not present

## 2019-11-16 ENCOUNTER — Ambulatory Visit: Payer: Medicare HMO | Admitting: Family Medicine

## 2019-11-18 ENCOUNTER — Other Ambulatory Visit: Payer: Self-pay | Admitting: Family Medicine

## 2019-11-18 DIAGNOSIS — E039 Hypothyroidism, unspecified: Secondary | ICD-10-CM

## 2019-11-18 NOTE — Telephone Encounter (Signed)
[place]. NTBS 30 days given 10/26/19 Last TSH 10/2018

## 2019-11-19 ENCOUNTER — Ambulatory Visit (INDEPENDENT_AMBULATORY_CARE_PROVIDER_SITE_OTHER): Payer: Medicare HMO | Admitting: Otolaryngology

## 2019-11-19 NOTE — Telephone Encounter (Signed)
Left message for patient to call back to schedule apt for medication refill.

## 2019-12-25 DIAGNOSIS — R05 Cough: Secondary | ICD-10-CM | POA: Diagnosis not present

## 2019-12-25 DIAGNOSIS — R1314 Dysphagia, pharyngoesophageal phase: Secondary | ICD-10-CM | POA: Diagnosis not present

## 2019-12-25 DIAGNOSIS — R490 Dysphonia: Secondary | ICD-10-CM | POA: Diagnosis not present

## 2019-12-25 DIAGNOSIS — K219 Gastro-esophageal reflux disease without esophagitis: Secondary | ICD-10-CM | POA: Diagnosis not present

## 2019-12-25 DIAGNOSIS — J3089 Other allergic rhinitis: Secondary | ICD-10-CM | POA: Diagnosis not present

## 2020-01-08 ENCOUNTER — Encounter: Payer: Self-pay | Admitting: Family Medicine

## 2020-01-08 DIAGNOSIS — E039 Hypothyroidism, unspecified: Secondary | ICD-10-CM

## 2020-01-08 MED ORDER — LEVOTHYROXINE SODIUM 50 MCG PO TABS: 50.0000 ug | ORAL_TABLET | Freq: Every day | ORAL | 0 refills | Status: DC

## 2020-02-12 ENCOUNTER — Encounter: Payer: Self-pay | Admitting: Family Medicine

## 2020-02-12 ENCOUNTER — Other Ambulatory Visit: Payer: Self-pay

## 2020-02-12 ENCOUNTER — Ambulatory Visit (INDEPENDENT_AMBULATORY_CARE_PROVIDER_SITE_OTHER): Payer: Medicare HMO | Admitting: Family Medicine

## 2020-02-12 VITALS — BP 142/82 | HR 68 | Temp 97.6°F | Ht 61.75 in | Wt 145.2 lb

## 2020-02-12 DIAGNOSIS — E782 Mixed hyperlipidemia: Secondary | ICD-10-CM | POA: Diagnosis not present

## 2020-02-12 DIAGNOSIS — R1319 Other dysphagia: Secondary | ICD-10-CM | POA: Diagnosis not present

## 2020-02-12 DIAGNOSIS — E039 Hypothyroidism, unspecified: Secondary | ICD-10-CM | POA: Diagnosis not present

## 2020-02-12 DIAGNOSIS — R6889 Other general symptoms and signs: Secondary | ICD-10-CM

## 2020-02-12 DIAGNOSIS — Z1211 Encounter for screening for malignant neoplasm of colon: Secondary | ICD-10-CM

## 2020-02-12 DIAGNOSIS — K2100 Gastro-esophageal reflux disease with esophagitis, without bleeding: Secondary | ICD-10-CM

## 2020-02-12 MED ORDER — LEVOTHYROXINE SODIUM 50 MCG PO TABS: 50.0000 ug | ORAL_TABLET | Freq: Every day | ORAL | 3 refills | Status: DC

## 2020-02-12 NOTE — Patient Instructions (Signed)
You had labs performed today.  You will be contacted with the results of the labs once they are available, usually in the next 3 business days for routine lab work.  If you have an active my chart account, they will be released to your MyChart.  If you prefer to have these labs released to you via telephone, please let us know.  You should hear from [place] within the next week for an appointment.  If you do not hear from appointment, please contact our office and asked for [facility]  She is our referral coordinator.   High Cholesterol  High cholesterol is a condition in which the blood has high levels of a white, waxy, fat-like substance (cholesterol). The human body needs small amounts of cholesterol. The liver makes all the cholesterol that the body needs. Extra (excess) cholesterol comes from the food that we eat. Cholesterol is carried from the liver by the blood through the blood vessels. If you have high cholesterol, deposits (plaques) may build up on the walls of your blood vessels (arteries). Plaques make the arteries narrower and stiffer. Cholesterol plaques increase your risk for heart attack and stroke. Work with your health care provider to keep your cholesterol levels in a healthy range. What increases the risk? This condition is more likely to develop in people who:  Eat foods that are high in animal fat (saturated fat) or cholesterol.  Are overweight.  Are not getting enough exercise.  Have a family history of high cholesterol. What are the signs or symptoms? There are no symptoms of this condition. How is this diagnosed? This condition may be diagnosed from the results of a blood test.  If you are older than age 33, your health care provider may check your cholesterol every 4-6 years.  You may be checked more often if you already have high cholesterol or other risk factors for heart disease. The blood test for cholesterol measures:  "Bad" cholesterol (LDL  cholesterol). This is the main type of cholesterol that causes heart disease. The desired level for LDL is less than 100.  "Good" cholesterol (HDL cholesterol). This type helps to protect against heart disease by cleaning the arteries and carrying the LDL away. The desired level for HDL is 60 or higher.  Triglycerides. These are fats that the body can store or burn for energy. The desired number for triglycerides is lower than 150.  Total cholesterol. This is a measure of the total amount of cholesterol in your blood, including LDL cholesterol, HDL cholesterol, and triglycerides. A healthy number is less than 200. How is this treated? This condition is treated with diet changes, lifestyle changes, and medicines. Diet changes  This may include eating more whole grains, fruits, vegetables, nuts, and fish.  This may also include cutting back on red meat and foods that have a lot of added sugar. Lifestyle changes  Changes may include getting at least 40 minutes of aerobic exercise 3 times a week. Aerobic exercises include walking, biking, and swimming. Aerobic exercise along with a healthy diet can help you maintain a healthy weight.  Changes may also include quitting smoking. Medicines  Medicines are usually given if diet and lifestyle changes have failed to reduce your cholesterol to healthy levels.  Your health care provider may prescribe a statin medicine. Statin medicines have been shown to reduce cholesterol, which can reduce the risk of heart disease. Follow these instructions at home: Eating and drinking If told by your health care provider:  Eat chicken (without skin), fish, veal, shellfish, ground [place] breast, and round or loin cuts of red meat.  Do not eat fried foods or fatty meats, such as hot dogs and salami.  Eat plenty of fruits, such as apples.  Eat plenty of vegetables, such as broccoli, potatoes, and carrots.  Eat beans, peas, and lentils.  Eat grains such as  barley, rice, couscous, and bulgur wheat.  Eat pasta without cream sauces.  Use skim or nonfat milk, and eat low-fat or nonfat yogurt and cheeses.  Do not eat or drink whole milk, cream, ice cream, egg yolks, or hard cheeses.  Do not eat stick margarine or tub margarines that contain trans fats (also called partially hydrogenated oils).  Do not eat saturated tropical oils, such as coconut oil and palm oil.  Do not eat cakes, cookies, crackers, or other baked goods that contain trans fats.  General instructions  Exercise as directed by your health care provider. Increase your activity level with activities such as gardening, walking, and taking the stairs.  Take over-the-counter and prescription medicines only as told by your health care provider.  Do not use any products that contain nicotine or tobacco, such as cigarettes and e-cigarettes. If you need help quitting, ask your health care provider.  Keep all follow-up visits as told by your health care provider. This is important. Contact a health care provider if:  You are struggling to maintain a healthy diet or weight.  You need help to start on an exercise program.  You need help to stop smoking. Get help right away if:  You have chest pain.  You have trouble breathing. This information is not intended to replace advice given to you by your health care provider. Make sure you discuss any questions you have with your health care provider. Document Revised: 03/22/2017 Document Reviewed: 09/17/2015 Elsevier Patient Education  [place].

## 2020-02-12 NOTE — Progress Notes (Signed)
Subjective: CC: follow up hypothyroidism, hyperlipidemia PCP: Loman Brooklyn, FNP LEX:[id number] Anne Morris is a 74 y.o. female presenting to clinic today for:  1. Hypothyroidism History: Acquired, incidentally found on laboratory work-up.  No history of radiation or surgery to the neck.  No known family history of thyroid disorders  Patient reports compliance with Synthroid 50 mcg daily.  No changes in bowel habits.  No tremors, palpitations.  She is always been asymptomatic.  2. Hyperlipidemia/ GERD She admits that she does not really do anything to modify diet.  She is not taking the red yeast rice supplement.  However, she does wish to pursue naturopathic approaches to her health.  She tried eating smaller meals after seeing the ENT and being found to have uncontrolled GERD.  However, her husband often comes home hungry and she eats with him in the evening time.  She does report excessive phlegm in the throat.  This does seem to be somewhat better with the use of Gaviscon and the other prescribed GERD medicine.  She is not quite sure what is called.  She is also using a nasal spray and a allergy pill which has helped with the congestion.  No hematochezia, melena.  She has an FOBT that she would like to have one today.  She used to see [facility] in [place] for colonoscopy.  She would like to have further evaluation of her GERD.  Additionally, she reports dysphagia with both solids and liquids.   ROS: Per HPI  Allergies  Allergen Reactions  . Erythromycin Nausea Only   Past Medical History:  Diagnosis Date  . Abnormal Pap smear of cervix    ?  Marland Kitchen Allergy    seasonal  . Arthritis   . Hyperlipidemia   . Osteopenia   . Thyroid disease    hypothyroidism    Current Outpatient Medications:  .  atorvastatin (LIPITOR) 20 MG tablet, Take 1 tablet (20 mg total) by mouth daily., Disp: 90 tablet, Rfl: 3 .  Calcium Carbonate-Vitamin D (CALTRATE 600+D) 600-400 MG-UNIT tablet, Take 1 tablet by  mouth daily., Disp: , Rfl:  .  Cholecalciferol (VITAMIN D PO), Take 500 Int'l Units by mouth. 6 days a week, Disp: , Rfl:  .  fluticasone (FLONASE) 50 MCG/ACT nasal spray, Place 2 sprays into both nostrils daily., Disp: 16 g, Rfl: 6 .  levothyroxine (SYNTHROID) 50 MCG tablet, Take 1 tablet (50 mcg total) by mouth daily. (Needs to be seen before next refill), Disp: 30 tablet, Rfl: 0 .  Red Yeast Rice Extract (RED YEAST RICE PO), Take by mouth., Disp: , Rfl:  Social History   Socioeconomic History  . Marital status: Married    Spouse name: Not on file  . Number of children: Not on file  . Years of education: Not on file  . Highest education level: Not on file  Occupational History  . Not on file  Tobacco Use  . Smoking status: Former Smoker    Quit date: 04/02/1978    Years since quitting: 41.8  . Smokeless tobacco: Never Used  Substance and Sexual Activity  . Alcohol use: No    Alcohol/week: 0.0 standard drinks  . Drug use: No  . Sexual activity: Yes    Partners: Male    Birth control/protection: Surgical    Comment: BTSP  Other Topics Concern  . Not on file  Social History Narrative  . Not on file   Social Determinants of Health   Financial Resource Strain:   .  Difficulty of Paying Living Expenses: Not on file  Food Insecurity:   . Worried About Charity fundraiser in the Last Year: Not on file  . Ran Out of Food in the Last Year: Not on file  Transportation Needs:   . Lack of Transportation (Medical): Not on file  . Lack of Transportation (Non-Medical): Not on file  Physical Activity:   . Days of Exercise per Week: Not on file  . Minutes of Exercise per Session: Not on file  Stress:   . Feeling of Stress : Not on file  Social Connections:   . Frequency of Communication with Friends and Family: Not on file  . Frequency of Social Gatherings with Friends and Family: Not on file  . Attends Religious Services: Not on file  . Active Member of Clubs or Organizations: Not  on file  . Attends Archivist Meetings: Not on file  . Marital Status: Not on file  Intimate Partner Violence:   . Fear of Current or Ex-Partner: Not on file  . Emotionally Abused: Not on file  . Physically Abused: Not on file  . Sexually Abused: Not on file   Family History  Problem Relation Age of Onset  . Cancer Mother        breast  . Breast cancer Mother   . Emphysema Father     Objective: Office vital signs reviewed. BP (!) 142/82   Pulse 68   Temp 97.6 F (36.4 Anne)   Ht 5' 1.75" (1.568 m)   Wt 145 lb 3.2 oz (65.9 kg)   LMP 10/08/2007 (Exact Date)   SpO2 100%   BMI 26.77 kg/m   Physical Examination:  General: Awake, alert, well nourished, No acute distress HEENT: Normal; sclera white.  No exophthalmos.  No palpable thyroid nodules; no oropharyngeal masses appreciated Cardio: regular rate and rhythm, S1S2 heard, no murmurs appreciated Pulm: clear to auscultation bilaterally, no wheezes, rhonchi or rales; normal work of breathing on room air GI: soft, non-tender, non-distended, bowel sounds present x4, no hepatomegaly, no splenomegaly, no masses Extremities: warm, well perfused, No edema, cyanosis or clubbing; +2 pulses bilaterally MSK: normal gait and station Skin: dry; intact; no rashes or lesions; normal temperature Neuro: No tremor  Assessment/ Plan: 74 y.o. female   1. Acquired hypothyroidism ?  Dysphagia is attributed to this versus from GI tract.  Check thyroid panel - Thyroid Panel With TSH  2. Mixed hyperlipidemia Check fasting lipid panel and hepatic function tests.  We discussed need to modify diet in efforts to naturally bring her cholesterol down as she is resistant to prescribed medications. - Hepatic Function Panel - Lipid Panel  3. Throat congestion Likely secondary to uncontrolled GERD - Ambulatory referral to Gastroenterology  4. Gastroesophageal reflux disease with esophagitis without hemorrhage - Ambulatory referral to  Gastroenterology  5. Esophageal dysphagia - Ambulatory referral to Gastroenterology  6. Screen for colon cancer - Fecal occult blood, imunochemical; Future   No orders of the defined types were placed in this encounter.  No orders of the defined types were placed in this encounter.   Janora Norlander, DO [place] [phone]

## 2020-02-13 LAB — HEPATIC FUNCTION PANEL
ALT: 20 IU/L (ref 0–32)
AST: 27 IU/L (ref 0–40)
Albumin: 4.1 g/dL (ref 3.7–4.7)
Alkaline Phosphatase: 122 IU/L — ABNORMAL HIGH (ref 44–121)
Bilirubin Total: 0.3 mg/dL (ref 0.0–1.2)
Bilirubin, Direct: 0.1 mg/dL (ref 0.00–0.40)
Total Protein: 6.7 g/dL (ref 6.0–8.5)

## 2020-02-13 LAB — THYROID PANEL WITH TSH
Free Thyroxine Index: 2.5 (ref 1.2–4.9)
T3 Uptake Ratio: 26 % (ref 24–39)
T4, Total: 9.5 ug/dL (ref 4.5–12.0)
TSH: 1.44 u[IU]/mL (ref 0.450–4.500)

## 2020-02-13 LAB — LIPID PANEL
Chol/HDL Ratio: 4.3 ratio (ref 0.0–4.4)
Cholesterol, Total: 206 mg/dL — ABNORMAL HIGH (ref 100–199)
HDL: 48 mg/dL (ref >39)
LDL Chol Calc (NIH): 137 mg/dL — ABNORMAL HIGH (ref 0–99)
Triglycerides: 118 mg/dL (ref 0–149)
VLDL Cholesterol Cal: 21 mg/dL (ref 5–40)

## 2020-02-15 MED ORDER — LEVOTHYROXINE SODIUM 50 MCG PO TABS: 50.0000 ug | ORAL_TABLET | Freq: Every day | ORAL | 3 refills | Status: DC

## 2020-02-15 NOTE — Addendum Note (Signed)
Addended by: Nigel Berthold C on: 02/15/2020 02:21 PM   Modules accepted: Orders

## 2020-02-18 ENCOUNTER — Other Ambulatory Visit: Payer: Medicare HMO

## 2020-02-18 ENCOUNTER — Other Ambulatory Visit: Payer: Self-pay

## 2020-06-06 ENCOUNTER — Encounter: Payer: Self-pay | Admitting: Gastroenterology

## 2020-06-21 ENCOUNTER — Other Ambulatory Visit: Payer: Self-pay

## 2020-06-21 ENCOUNTER — Ambulatory Visit: Payer: Medicare HMO | Admitting: Gastroenterology

## 2020-06-21 ENCOUNTER — Encounter: Payer: Self-pay | Admitting: Gastroenterology

## 2020-06-21 VITALS — BP 110/60 | HR 72 | Ht 61.75 in | Wt 143.0 lb

## 2020-06-21 DIAGNOSIS — K219 Gastro-esophageal reflux disease without esophagitis: Secondary | ICD-10-CM | POA: Insufficient documentation

## 2020-06-21 DIAGNOSIS — Z1211 Encounter for screening for malignant neoplasm of colon: Secondary | ICD-10-CM

## 2020-06-21 DIAGNOSIS — R059 Cough, unspecified: Secondary | ICD-10-CM | POA: Diagnosis not present

## 2020-06-21 DIAGNOSIS — R490 Dysphonia: Secondary | ICD-10-CM | POA: Diagnosis not present

## 2020-06-21 MED ORDER — PLENVU 140 G PO SOLR: ORAL | 0 refills | Status: DC

## 2020-06-21 NOTE — Patient Instructions (Signed)
You have been scheduled for an endoscopy and colonoscopy. Please follow the written instructions given to you at your visit today. Please pick up your prep supplies at the pharmacy within the next 1-3 days. If you use inhalers (even only as needed), please bring them with you on the day of your procedure.   If you are age 75 or older, your body mass index should be between 23-30. Your Body mass index is 26.37 kg/m. If this is out of the aforementioned range listed, please consider follow up with your Primary Care Provider.  If you are age 50 or younger, your body mass index should be between 19-25. Your Body mass index is 26.37 kg/m. If this is out of the aformentioned range listed, please consider follow up with your Primary Care Provider.    Due to recent changes in healthcare laws, you may see the results of your imaging and laboratory studies on MyChart before your provider has had a chance to review them.  We understand that in some cases there may be results that are confusing or concerning to you. Not all laboratory results come back in the same time frame and the provider may be waiting for multiple results in order to interpret others.  Please give [place] 48 hours in order for your provider to thoroughly review all the results before contacting the office for clarification of your results.    [employer]'s Current Therapy 2021 (pp. 213-216). [place], [place]: Elsevier.">  Gastroesophageal Reflux Disease, Adult Gastroesophageal reflux (GER) happens when acid from the stomach flows up into the tube that connects the mouth and the stomach (esophagus). Normally, food travels down the esophagus and stays in the stomach to be digested. However, when a person has GER, food and stomach acid sometimes move back up into the esophagus. If this becomes a more serious problem, the person may be diagnosed with a disease called gastroesophageal reflux disease (GERD). GERD occurs when the reflux:  Happens  often.  Causes frequent or severe symptoms.  Causes problems such as damage to the esophagus. When stomach acid comes in contact with the esophagus, the acid may cause inflammation in the esophagus. Over time, GERD may create small holes (ulcers) in the lining of the esophagus. What are the causes? This condition is caused by a problem with the muscle between the esophagus and the stomach (lower esophageal sphincter, or LES). Normally, the LES muscle closes after food passes through the esophagus to the stomach. When the LES is weakened or abnormal, it does not close properly, and that allows food and stomach acid to go back up into the esophagus. The LES can be weakened by certain dietary substances, medicines, and medical conditions, including:  Tobacco use.  Pregnancy.  Having a hiatal hernia.  Alcohol use.  Certain foods and beverages, such as coffee, chocolate, onions, and peppermint. What increases the risk? You are more likely to develop this condition if you:  Have an increased body weight.  Have a connective tissue disorder.  Take NSAIDs, such as ibuprofen. What are the signs or symptoms? Symptoms of this condition include:  Heartburn.  Difficult or painful swallowing and the feeling of having a lump in the throat.  A bitter taste in the mouth.  Bad breath and having a large amount of saliva.  Having an upset or bloated stomach and belching.  Chest pain. Different conditions can cause chest pain. Make sure you see your health care provider if you experience chest pain.  Shortness of breath  or wheezing.  Ongoing (chronic) cough or a nighttime cough.  Wearing away of tooth enamel.  Weight loss. How is this diagnosed? This condition may be diagnosed based on a medical history and a physical exam. To determine if you have mild or severe GERD, your health care provider may also monitor how you respond to treatment. You may also have tests, including:  A test to  examine your stomach and esophagus with a small camera (endoscopy).  A test that measures the acidity level in your esophagus.  A test that measures how much pressure is on your esophagus.  A barium swallow or modified barium swallow test to show the shape, size, and functioning of your esophagus. How is this treated? Treatment for this condition may vary depending on how severe your symptoms are. Your health care provider may recommend:  Changes to your diet.  Medicine.  Surgery. The goal of treatment is to help relieve your symptoms and to prevent complications. Follow these instructions at home: Eating and drinking  Follow a diet as recommended by your health care provider. This may involve avoiding foods and drinks such as: ? Coffee and tea, with or without caffeine. ? Drinks that contain alcohol. ? Energy drinks and sports drinks. ? Carbonated drinks or sodas. ? Chocolate and cocoa. ? Peppermint and mint flavorings. ? Garlic and onions. ? Horseradish. ? Spicy and acidic foods, including peppers, chili powder, curry powder, vinegar, hot sauces, and barbecue sauce. ? Citrus fruit juices and citrus fruits, such as oranges, lemons, and limes. ? Tomato-based foods, such as red sauce, chili, salsa, and pizza with red sauce. ? Fried and fatty foods, such as donuts, french fries, potato chips, and high-fat dressings. ? High-fat meats, such as hot dogs and fatty cuts of red and white meats, such as rib eye steak, sausage, ham, and bacon. ? High-fat dairy items, such as whole milk, butter, and cream cheese.  Eat small, frequent meals instead of large meals.  Avoid drinking large amounts of liquid with your meals.  Avoid eating meals during the 2-3 hours before bedtime.  Avoid lying down right after you eat.  Do not exercise right after you eat.   Lifestyle  Do not use any products that contain nicotine or tobacco. These products include cigarettes, chewing tobacco, and vaping  devices, such as e-cigarettes. If you need help quitting, ask your health care provider.  Try to reduce your stress by using methods such as yoga or meditation. If you need help reducing stress, ask your health care provider.  If you are overweight, reduce your weight to an amount that is healthy for you. Ask your health care provider for guidance about a safe weight loss goal.   General instructions  Pay attention to any changes in your symptoms.  Take over-the-counter and prescription medicines only as told by your health care provider. Do not take aspirin, ibuprofen, or other NSAIDs unless your health care provider told you to take these medicines.  Wear loose-fitting clothing. Do not wear anything tight around your waist that causes pressure on your abdomen.  Raise (elevate) the head of your bed about 6 inches (15 cm). You can use a wedge to do this.  Avoid bending over if this makes your symptoms worse.  Keep all follow-up visits. This is important. Contact a health care provider if:  You have: ? New symptoms. ? Unexplained weight loss. ? Difficulty swallowing or it hurts to swallow. ? Wheezing or a persistent cough. ? A  hoarse voice.  Your symptoms do not improve with treatment. Get help right away if:  You have sudden pain in your arms, neck, jaw, teeth, or back.  You suddenly feel sweaty, dizzy, or light-headed.  You have chest pain or shortness of breath.  You vomit and the vomit is green, yellow, or black, or it looks like blood or coffee grounds.  You faint.  You have stool that is red, bloody, or black.  You cannot swallow, drink, or eat. These symptoms may represent a serious problem that is an emergency. Do not wait to see if the symptoms will go away. Get medical help right away. Call your local emergency services (911 in the [place] Do not drive yourself to the hospital. Summary  Gastroesophageal reflux happens when acid from the stomach flows up into the  esophagus. GERD is a disease in which the reflux happens often, causes frequent or severe symptoms, or causes problems such as damage to the esophagus.  Treatment for this condition may vary depending on how severe your symptoms are. Your health care provider may recommend diet and lifestyle changes, medicine, or surgery.  Contact a health care provider if you have new or worsening symptoms.  Take over-the-counter and prescription medicines only as told by your health care provider. Do not take aspirin, ibuprofen, or other NSAIDs unless your health care provider told you to do so.  Keep all follow-up visits as told by your health care provider. This is important. This information is not intended to replace advice given to you by your health care provider. Make sure you discuss any questions you have with your health care provider. Document Revised: 09/28/2019 Document Reviewed: 09/28/2019 Elsevier Patient Education  2021 [employer].  I appreciate the  opportunity to care for you  Thank You   Janett Billow Zehr,PA-C

## 2020-06-21 NOTE — Progress Notes (Signed)
Reviewed and agree with management plan.  Malcolm T. Stark, MD FACG [phone]  

## 2020-06-21 NOTE — Progress Notes (Signed)
06/21/2020 Anne Morris [id number] Oct 24, 1945   HISTORY OF PRESENT ILLNESS: This is a 75 year old female who is a patient of Dr. Silvio Pate.  She was last seen here for her colonoscopy 10 years ago in April 2012.  At that time she was found to have mild diverticulosis.  She is here today to schedule another colonoscopy and also discuss her upper GI symptoms.  She tells me that about a year ago she started having a lot of mucus, hoarseness, and cough.  She thought it was allergies.  She was on several courses of prednisone and antibiotics.  Then she went to see ENT and they told her it was not allergies, but acid reflux.  She was then placed on Prilosec and has been on that medication since that time.  She says that the mucus and the cough did resolve, but the hoarseness never resolved 100%.  She has now been off the Prilosec for about 2 weeks.  She says that she prefers to try to manage it with dietary measures if possible.  She denies any dysphagia.  She never had an EGD in the past.  She does admit to some epigastric discomfort.  She reports that she knows that she has IBS and will get diarrhea with that on occasion.  Says those episodes usually last a day or so and then resolve.  She denies any rectal bleeding.   Past Medical History:  Diagnosis Date  . Abnormal Pap smear of cervix    ?  Marland Kitchen Allergy    seasonal  . Arthritis   . GERD (gastroesophageal reflux disease)   . Hyperlipidemia   . Osteopenia   . Thyroid disease    hypothyroidism   Past Surgical History:  Procedure Laterality Date  . COLONOSCOPY  07/13/10   small polyp follow in 10 years  . history of iud use  in her 29's  . POLYPECTOMY    . SHOULDER ADHESION RELEASE Left 01/2001  . TUBAL LIGATION      reports that she quit smoking about 42 years ago. She has never used smokeless tobacco. She reports that she does not drink alcohol and does not use drugs. family history includes Breast cancer in her mother; Cancer in her  mother; Emphysema in her father. Allergies  Allergen Reactions  . Erythromycin Nausea Only      Outpatient Encounter Medications as of 06/21/2020  Medication Sig  . Acetylcysteine, Nutrient, (N-ACETYL CYSTEINE) 600 MG TABS Take by mouth.  . Al Hyd-Mg Tr-Alg Ac-Sod Bicarb (GAVISCON-2 PO) Take by mouth.  . Ascorbic Acid (VITAMIN C) 1000 MG tablet Take 1,000 mg by mouth daily.  . Calcium 600-200 MG-UNIT tablet Take 1 tablet by mouth daily.  . Cholecalciferol (VITAMIN D PO) Take 500 Int'l Units by mouth. 6 days a week  . fexofenadine (ALLEGRA) 180 MG tablet Take 180 mg by mouth daily.  Marland Kitchen levothyroxine (SYNTHROID) 50 MCG tablet Take 1 tablet (50 mcg total) by mouth daily.  . Red Yeast Rice 600 MG CAPS Take by mouth.  . [DISCONTINUED] omeprazole (PRILOSEC) 20 MG capsule Take 20 mg by mouth daily. (Patient not taking: Reported on 06/21/2020)   No facility-administered encounter medications on file as of 06/21/2020.     REVIEW OF SYSTEMS  : All other systems reviewed and negative except where noted in the History of Present Illness.   PHYSICAL EXAM: BP 110/60   Pulse 72   Ht 5' 1.75" (1.568 m)   Wt 143 lb (  64.9 kg)   LMP 10/08/2007 (Exact Date)   SpO2 98%   BMI 26.37 kg/m  General: Well developed white female in no acute distress Head: Normocephalic and atraumatic Eyes:  Sclerae anicteric, conjunctiva pink. Ears: Normal auditory acuity Lungs: Clear throughout to auscultation; no W/R/R. Heart: Regular rate and rhythm; no M/R/G. Abdomen: Soft, non-distended.  BS present.  Non-tender Rectal:  Will be done at the time of colonoscopy. Musculoskeletal: Symmetrical with no gross deformities  Skin: No lesions on visible extremities Extremities: No edema  Neurological: Alert oriented x 4, grossly non-focal Psychological:  Alert and cooperative. Normal mood and affect  ASSESSMENT AND PLAN: *GERD, hoarseness, cough: She has never had an EGD in the past.  Some of her symptoms were better  on Prilosec, but the hoarseness never resolved 100%.  She has now been off of the PPI for about 2 weeks.  She prefers to try to manage it with dietary measures if she can.  We discussed reflux diet and she was given literature on this.  We will plan for EGD with Dr. Fuller Plan. *CRC screening:  Last colonoscopy 10 years ago.  Will schedule colonoscopy with Dr. Fuller Plan.    **The risks, benefits, and alternatives to EGD and colonoscopy were discussed with the patient and she consents to proceed.   CC:  Loman Brooklyn, FNP

## 2020-08-08 ENCOUNTER — Encounter: Payer: Self-pay | Admitting: Gastroenterology

## 2020-08-17 ENCOUNTER — Encounter: Payer: Self-pay | Admitting: Gastroenterology

## 2020-08-17 ENCOUNTER — Ambulatory Visit (AMBULATORY_SURGERY_CENTER): Payer: Medicare HMO | Admitting: Gastroenterology

## 2020-08-17 ENCOUNTER — Other Ambulatory Visit: Payer: Self-pay

## 2020-08-17 VITALS — BP 119/47 | HR 64 | Temp 97.8°F | Resp 12 | Ht 61.0 in | Wt 143.0 lb

## 2020-08-17 DIAGNOSIS — K317 Polyp of stomach and duodenum: Secondary | ICD-10-CM

## 2020-08-17 DIAGNOSIS — D123 Benign neoplasm of transverse colon: Secondary | ICD-10-CM

## 2020-08-17 DIAGNOSIS — D122 Benign neoplasm of ascending colon: Secondary | ICD-10-CM

## 2020-08-17 DIAGNOSIS — K635 Polyp of colon: Secondary | ICD-10-CM | POA: Diagnosis not present

## 2020-08-17 DIAGNOSIS — Z1211 Encounter for screening for malignant neoplasm of colon: Secondary | ICD-10-CM | POA: Diagnosis not present

## 2020-08-17 DIAGNOSIS — K219 Gastro-esophageal reflux disease without esophagitis: Secondary | ICD-10-CM

## 2020-08-17 MED ORDER — SODIUM CHLORIDE 0.9 % IV SOLN
500.0000 mL | Freq: Once | INTRAVENOUS | Status: AC
Start: 2020-08-17 — End: ?

## 2020-08-17 NOTE — Progress Notes (Signed)
[facility]  Pt's states no medical or surgical changes since previsit or office visit.  

## 2020-08-17 NOTE — Op Note (Signed)
[facility] Patient Name: Anne Morris Procedure Date: 08/17/2020 3:10 PM MRN: [id number] Endoscopist: Ladene Artist , MD Age: 75 Referring MD:  Date of Birth: [birth year] Gender: Female Account #: [id number] Procedure:                Upper GI endoscopy Indications:              Gastroesophageal reflux disease Medicines:                Monitored Anesthesia Care Procedure:                Pre-Anesthesia Assessment:                           - Prior to the procedure, a History and Physical                            was performed, and patient medications and                            allergies were reviewed. The patient's tolerance of                            previous anesthesia was also reviewed. The risks                            and benefits of the procedure and the sedation                            options and risks were discussed with the patient.                            All questions were answered, and informed consent                            was obtained. Prior Anticoagulants: The patient has                            taken no previous anticoagulant or antiplatelet                            agents. ASA Grade Assessment: II - A patient with                            mild systemic disease. After reviewing the risks                            and benefits, the patient was deemed in                            satisfactory condition to undergo the procedure.                           After obtaining informed consent, the endoscope was  passed under direct vision. Throughout the                            procedure, the patient's blood pressure, pulse, and                            oxygen saturations were monitored continuously. The                            Endoscope was introduced through the mouth, and                            advanced to the second part of duodenum. The upper                            GI endoscopy was  accomplished without difficulty.                            The patient tolerated the procedure well. Scope In: Scope Out: Findings:                 The examined esophagus was normal.                           A single 3 mm sessile polyp with no bleeding and no                            stigmata of recent bleeding was found in the                            gastric fundus. The polyp was removed with a cold                            biopsy forceps. Resection and retrieval were                            complete.                           A small non-bleeding diverticulum was found in the                            gastric fundus.                           The exam of the stomach was otherwise normal.                           The duodenal bulb and second portion of the                            duodenum were normal. Complications:            No immediate complications. Estimated Blood Loss:     Estimated blood loss was minimal. Impression:               -  Normal esophagus.                           - A single gastric polyp. Resected and retrieved.                           - Gastric diverticulum.                           - Normal duodenal bulb and second portion of the                            duodenum. Recommendation:           - Patient has a contact number available for                            emergencies. The signs and symptoms of potential                            delayed complications were discussed with the                            patient. Return to normal activities tomorrow.                            Written discharge instructions were provided to the                            patient.                           - Resume previous diet.                           - Follow antireflux measures.                           - Continue present medications.                           - Await pathology results. Ladene Artist, MD 08/17/2020 3:49:29 PM This report has  been signed electronically.

## 2020-08-17 NOTE — Patient Instructions (Signed)
Impression/Recommendations:  GERD, polyp,high fiber diet, and diverticulosis handouts given to patient.  Resume previous diet. Follow antireflux measures. Continue present medications. Await pathology results.  YOU HAD AN ENDOSCOPIC PROCEDURE TODAY AT [facility]:   Refer to the procedure report that was given to you for any specific questions about what was found during the examination.  If the procedure report does not answer your questions, please call your gastroenterologist to clarify.  If you requested that your care partner not be given the details of your procedure findings, then the procedure report has been included in a sealed envelope for you to review at your convenience later.  YOU SHOULD EXPECT: Some feelings of bloating in the abdomen. Passage of more gas than usual.  Walking can help get rid of the air that was put into your GI tract during the procedure and reduce the bloating. If you had a lower endoscopy (such as a colonoscopy or flexible sigmoidoscopy) you may notice spotting of blood in your stool or on the toilet paper. If you underwent a bowel prep for your procedure, you may not have a normal bowel movement for a few days.  Please Note:  You might notice some irritation and congestion in your nose or some drainage.  This is from the oxygen used during your procedure.  There is no need for concern and it should clear up in a day or so.  SYMPTOMS TO REPORT IMMEDIATELY:   Following lower endoscopy (colonoscopy or flexible sigmoidoscopy):  Excessive amounts of blood in the stool  Significant tenderness or worsening of abdominal pains  Swelling of the abdomen that is new, acute  Fever of 100F or higher   Following upper endoscopy (EGD)  Vomiting of blood or coffee ground material  New chest pain or pain under the shoulder blades  Painful or persistently difficult swallowing  New shortness of breath  Fever of 100F or higher  Black, tarry-looking  stools  For urgent or emergent issues, a gastroenterologist can be reached at any hour by calling [phone]. Do not use MyChart messaging for urgent concerns.    DIET:  We do recommend a small meal at first, but then you may proceed to your regular diet.  Drink plenty of fluids but you should avoid alcoholic beverages for 24 hours.  ACTIVITY:  You should plan to take it easy for the rest of today and you should NOT DRIVE or use heavy machinery until tomorrow (because of the sedation medicines used during the test).    FOLLOW UP: Our staff will call the number listed on your records 48-72 hours following your procedure to check on you and address any questions or concerns that you may have regarding the information given to you following your procedure. If we do not reach you, we will leave a message.  We will attempt to reach you two times.  During this call, we will ask if you have developed any symptoms of COVID 19. If you develop any symptoms (ie: fever, flu-like symptoms, shortness of breath, cough etc.) before then, please call [phone].  If you test positive for Covid 19 in the 2 weeks post procedure, please call and report this information to [place].    If any biopsies were taken you will be contacted by phone or by letter within the next 1-3 weeks.  Please call us at [phone] if you have not heard about the biopsies in 3 weeks.    SIGNATURES/CONFIDENTIALITY: You and/or your care  partner have signed paperwork which will be entered into your electronic medical record.  These signatures attest to the fact that that the information above on your After Visit Summary has been reviewed and is understood.  Full responsibility of the confidentiality of this discharge information lies with you and/or your care-partner.

## 2020-08-17 NOTE — Progress Notes (Signed)
Pt Drowsy. VSS. To PACU, report to RN. No anesthetic complications noted.  

## 2020-08-17 NOTE — Op Note (Signed)
[place] Patient Name: Anne Morris Procedure Date: 08/17/2020 3:10 PM MRN: [id number] Endoscopist: Ladene Artist , MD Age: 75 Referring MD:  Date of Birth: March 25, 1946 Gender: Female Account #: [id number] Procedure:                Colonoscopy Indications:              Screening for colorectal malignant neoplasm Medicines:                Monitored Anesthesia Care Procedure:                Pre-Anesthesia Assessment:                           - Prior to the procedure, a History and Physical                            was performed, and patient medications and                            allergies were reviewed. The patient's tolerance of                            previous anesthesia was also reviewed. The risks                            and benefits of the procedure and the sedation                            options and risks were discussed with the patient.                            All questions were answered, and informed consent                            was obtained. Prior Anticoagulants: The patient has                            taken no previous anticoagulant or antiplatelet                            agents. ASA Grade Assessment: II - A patient with                            mild systemic disease. After reviewing the risks                            and benefits, the patient was deemed in                            satisfactory condition to undergo the procedure.                           After obtaining informed consent, the colonoscope  was passed under direct vision. Throughout the                            procedure, the patient's blood pressure, pulse, and                            oxygen saturations were monitored continuously. The                            Olympus CF-HQ190 [id number]) Colonoscope was                            introduced through the anus and advanced to the the                            cecum, identified by  appendiceal orifice and                            ileocecal valve. The ileocecal valve, appendiceal                            orifice, and rectum were photographed. The quality                            of the bowel preparation was good. The colonoscopy                            was performed without difficulty. The patient                            tolerated the procedure well. Scope In: 3:15:14 PM Scope Out: 3:34:17 PM Scope Withdrawal Time: 0 hours 15 minutes 5 seconds  Total Procedure Duration: 0 hours 19 minutes 3 seconds  Findings:                 The perianal and digital rectal examinations were                            normal.                           Five sessile polyps were found in the transverse                            colon (4) and ascending colon (1). The polyps were                            5 to 7 mm in size. These polyps were removed with a                            cold snare. Resection and retrieval were complete.                           Multiple medium-mouthed diverticula were found in  the left colon. There was no evidence of                            diverticular bleeding.                           The exam was otherwise without abnormality on                            direct and retroflexion views. Complications:            No immediate complications. Estimated blood loss:                            None. Estimated Blood Loss:     Estimated blood loss: none. Impression:               - Five 5 to 7 mm polyps in the transverse colon and                            in the ascending colon, removed with a cold snare.                            Resected and retrieved.                           - Moderate diverticulosis in the left colon.                           - The examination was otherwise normal on direct                            and retroflexion views. Recommendation:           - Repeat colonoscopy after studies are  complete for                            surveillance based on pathology results vs no                            repeat colonoscopy due to age.                           - Patient has a contact number available for                            emergencies. The signs and symptoms of potential                            delayed complications were discussed with the                            patient. Return to normal activities tomorrow.                            Written discharge instructions were provided  to the                            patient.                           - High fiber diet.                           - Continue present medications.                           - Await pathology results. Ladene Artist, MD 08/17/2020 3:37:27 PM This report has been signed electronically.

## 2020-08-19 ENCOUNTER — Telehealth: Payer: Self-pay | Admitting: *Deleted

## 2020-08-19 NOTE — Telephone Encounter (Signed)
  Follow up Call-  Call back number 08/17/2020  Post procedure Call Back phone  # [phone]  Permission to leave phone message Yes  Some recent data might be hidden     Patient questions:  Do you have a fever, pain , or abdominal swelling? No. Pain Score  0 *  Have you tolerated food without any problems? Yes.    Have you been able to return to your normal activities? Yes.    Do you have any questions about your discharge instructions: Diet   No. Medications  No. Follow up visit  No.  Do you have questions or concerns about your Care? No.  Actions: * If pain score is 4 or above: No action needed, pain <4.  1. Have you developed a fever since your procedure? no  2.   Have you had an respiratory symptoms (SOB or cough) since your procedure? no  3.   Have you tested positive for COVID 19 since your procedure no  4.   Have you had any family members/close contacts diagnosed with the COVID 19 since your procedure?  no   If yes to any of these questions please route to Joylene John, RN and Joella Prince, RN

## 2020-08-25 ENCOUNTER — Encounter: Payer: Self-pay | Admitting: Gastroenterology

## 2020-11-17 DIAGNOSIS — Z85828 Personal history of other malignant neoplasm of skin: Secondary | ICD-10-CM | POA: Diagnosis not present

## 2020-11-17 DIAGNOSIS — Z08 Encounter for follow-up examination after completed treatment for malignant neoplasm: Secondary | ICD-10-CM | POA: Diagnosis not present

## 2020-11-17 DIAGNOSIS — Z1283 Encounter for screening for malignant neoplasm of skin: Secondary | ICD-10-CM | POA: Diagnosis not present

## 2021-03-13 ENCOUNTER — Other Ambulatory Visit: Payer: Self-pay | Admitting: Family Medicine

## 2021-03-13 DIAGNOSIS — E039 Hypothyroidism, unspecified: Secondary | ICD-10-CM

## 2021-04-10 ENCOUNTER — Encounter: Payer: Self-pay | Admitting: Family Medicine

## 2021-04-10 ENCOUNTER — Ambulatory Visit (INDEPENDENT_AMBULATORY_CARE_PROVIDER_SITE_OTHER): Payer: Medicare HMO | Admitting: Family Medicine

## 2021-04-10 VITALS — BP 159/67 | HR 70 | Temp 98.3°F | Ht 61.0 in | Wt 148.2 lb

## 2021-04-10 DIAGNOSIS — E559 Vitamin D deficiency, unspecified: Secondary | ICD-10-CM | POA: Diagnosis not present

## 2021-04-10 DIAGNOSIS — E039 Hypothyroidism, unspecified: Secondary | ICD-10-CM | POA: Diagnosis not present

## 2021-04-10 DIAGNOSIS — E782 Mixed hyperlipidemia: Secondary | ICD-10-CM | POA: Diagnosis not present

## 2021-04-10 DIAGNOSIS — H524 Presbyopia: Secondary | ICD-10-CM | POA: Diagnosis not present

## 2021-04-10 DIAGNOSIS — H5213 Myopia, bilateral: Secondary | ICD-10-CM | POA: Diagnosis not present

## 2021-04-10 DIAGNOSIS — H52229 Regular astigmatism, unspecified eye: Secondary | ICD-10-CM | POA: Diagnosis not present

## 2021-04-10 DIAGNOSIS — H2510 Age-related nuclear cataract, unspecified eye: Secondary | ICD-10-CM | POA: Diagnosis not present

## 2021-04-10 DIAGNOSIS — H5203 Hypermetropia, bilateral: Secondary | ICD-10-CM | POA: Diagnosis not present

## 2021-04-10 NOTE — Progress Notes (Signed)
Subjective:  Patient ID: Anne Morris, female    DOB: 1945-12-26  Age: 76 y.o. MRN: [id number]  CC: Hypothyroidism   HPI KAISLYN GULAS presents for  follow-up on  thyroid. The patient has a history of hypothyroidism for many years. It has been stable recently. Pt. denies any change in  voice, loss of hair, heat or cold intolerance. Energy level has been adequate to good. Patient denies constipation and diarrhea. No myxedema. Medication is as noted below. Out of her levothyroxine for now. WAS taking every other day for about 2 weeks to stretch her supply. Verified that pt is taking it daily on an empty stomach. Well tolerated.  HAD a lot of salt yesterday. Possibly elevated her BP.    Depression screen [facility] 2/9 04/10/2021 02/12/2020 10/26/2019  Decreased Interest 0 0 0  Down, Depressed, Hopeless 0 0 0  PHQ - 2 Score 0 0 0    History Chaka has a past medical history of Abnormal Pap smear of cervix, Allergy, Arthritis, GERD (gastroesophageal reflux disease), Hyperlipidemia, Osteopenia, and Thyroid disease.   She has a past surgical history that includes Shoulder adhesion release (Left, 01/2001); Colonoscopy (07/13/10); Polypectomy; Tubal ligation; history of iud use (in her 20's); and Upper gastrointestinal endoscopy.   Her family history includes Breast cancer in her mother; Cancer in her mother; Emphysema in her father.She reports that she quit smoking about 43 years ago. Her smoking use included cigarettes. She has never used smokeless tobacco. She reports that she does not drink alcohol and does not use drugs.    ROS Review of Systems  Constitutional: Negative.   HENT: Negative.    Eyes:  Negative for visual disturbance.  Respiratory:  Negative for shortness of breath.   Cardiovascular:  Negative for chest pain.  Gastrointestinal:  Negative for abdominal pain.  Musculoskeletal:  Negative for arthralgias.   Objective:  BP (!) 159/67    Pulse 70    Temp 98.3 F (36.8 C)    Ht 5'  1" (1.549 m)    Wt 148 lb 3.2 oz (67.2 kg)    LMP 10/08/2007 (Exact Date)    SpO2 97%    BMI 28.00 kg/m   BP Readings from Last 3 Encounters:  04/10/21 (!) 159/67  08/17/20 (!) 119/47  06/21/20 110/60    Wt Readings from Last 3 Encounters:  04/10/21 148 lb 3.2 oz (67.2 kg)  08/17/20 143 lb (64.9 kg)  06/21/20 143 lb (64.9 kg)     Physical Exam Constitutional:      General: She is not in acute distress.    Appearance: She is well-developed.  HENT:     Head: Normocephalic and atraumatic.  Eyes:     Conjunctiva/sclera: Conjunctivae normal.     Pupils: Pupils are equal, round, and reactive to light.  Neck:     Thyroid: No thyromegaly.  Cardiovascular:     Rate and Rhythm: Normal rate and regular rhythm.     Heart sounds: Normal heart sounds. No murmur heard. Pulmonary:     Effort: Pulmonary effort is normal. No respiratory distress.     Breath sounds: Normal breath sounds. No wheezing or rales.  Abdominal:     General: Bowel sounds are normal. There is no distension.     Palpations: Abdomen is soft.     Tenderness: There is no abdominal tenderness.  Musculoskeletal:        General: Normal range of motion.     Cervical back: Normal range of  motion and neck supple.  Lymphadenopathy:     Cervical: No cervical adenopathy.  Skin:    General: Skin is warm and dry.  Neurological:     Mental Status: She is alert and oriented to person, place, and time.  Psychiatric:        Behavior: Behavior normal.        Thought Content: Thought content normal.        Judgment: Judgment normal.      Assessment & Plan:   Pearlina was seen today for hypothyroidism.  Diagnoses and all orders for this visit:  Mixed hyperlipidemia -     CBC with Differential/Platelet -     CMP14+EGFR -     Lipid panel  Acquired hypothyroidism -     CBC with Differential/Platelet -     CMP14+EGFR -     TSH + free T4  Vitamin D deficiency -     VITAMIN D 25 Hydroxy (Vit-D Deficiency,  Fractures)       I have discontinued Cyndy Freeze "Judy"'s N-Acetyl Cysteine, fexofenadine, Al Hyd-Mg Tr-Alg Ac-Sod Bicarb (GAVISCON-2 PO), omeprazole, and triamcinolone. I am also having her maintain her Cholecalciferol (VITAMIN D PO), levothyroxine, Calcium, Red Yeast Rice, and vitamin C. We will continue to administer sodium chloride.  Allergies as of 04/10/2021       Reactions   Erythromycin Nausea Only        Medication List        Accurate as of April 10, 2021  9:28 AM. If you have any questions, ask your nurse or doctor.          STOP taking these medications    fexofenadine 180 MG tablet Commonly known as: ALLEGRA Stopped by: Claretta Fraise, MD   GAVISCON-2 PO Stopped by: Claretta Fraise, MD   N-Acetyl Cysteine 600 MG Tabs Generic drug: Acetylcysteine (Nutrient) Stopped by: Claretta Fraise, MD   Nasacort Allergy 24HR 55 MCG/ACT Aero nasal inhaler Generic drug: triamcinolone Stopped by: Claretta Fraise, MD   omeprazole 20 MG capsule Commonly known as: PRILOSEC Stopped by: Claretta Fraise, MD       TAKE these medications    Calcium 600-200 MG-UNIT tablet Take 1 tablet by mouth daily.   levothyroxine 50 MCG tablet Commonly known as: SYNTHROID Take 1 tablet (50 mcg total) by mouth daily.   Red Yeast Rice 600 MG Caps Take by mouth.   vitamin C 1000 MG tablet Take 1,000 mg by mouth daily.   VITAMIN D PO Take 500 Int'l Units by mouth. 6 days a week         Follow-up: No follow-ups on file.  Claretta Fraise, M.D.

## 2021-04-11 ENCOUNTER — Ambulatory Visit (INDEPENDENT_AMBULATORY_CARE_PROVIDER_SITE_OTHER): Payer: Medicare HMO

## 2021-04-11 VITALS — Ht 61.0 in | Wt 148.0 lb

## 2021-04-11 DIAGNOSIS — Z0000 Encounter for general adult medical examination without abnormal findings: Secondary | ICD-10-CM

## 2021-04-11 LAB — LIPID PANEL
Chol/HDL Ratio: 4.7 ratio — ABNORMAL HIGH (ref 0.0–4.4)
Cholesterol, Total: 222 mg/dL — ABNORMAL HIGH (ref 100–199)
HDL: 47 mg/dL (ref >39)
LDL Chol Calc (NIH): 144 mg/dL — ABNORMAL HIGH (ref 0–99)
Triglycerides: 170 mg/dL — ABNORMAL HIGH (ref 0–149)
VLDL Cholesterol Cal: 31 mg/dL (ref 5–40)

## 2021-04-11 LAB — CMP14+EGFR
ALT: 27 IU/L (ref 0–32)
AST: 29 IU/L (ref 0–40)
Albumin/Globulin Ratio: 2.1 (ref 1.2–2.2)
Albumin: 4.5 g/dL (ref 3.7–4.7)
Alkaline Phosphatase: 107 IU/L (ref 44–121)
BUN/Creatinine Ratio: 24 (ref 12–28)
BUN: 20 mg/dL (ref 8–27)
Bilirubin Total: 0.5 mg/dL (ref 0.0–1.2)
CO2: 21 mmol/L (ref 20–29)
Calcium: 9.8 mg/dL (ref 8.7–10.3)
Chloride: 107 mmol/L — ABNORMAL HIGH (ref 96–106)
Creatinine, Ser: 0.84 mg/dL (ref 0.57–1.00)
Globulin, Total: 2.1 g/dL (ref 1.5–4.5)
Glucose: 97 mg/dL (ref 70–99)
Potassium: 4.8 mmol/L (ref 3.5–5.2)
Sodium: 144 mmol/L (ref 134–144)
Total Protein: 6.6 g/dL (ref 6.0–8.5)
eGFR: 72 mL/min/{1.73_m2} (ref >59)

## 2021-04-11 LAB — CBC WITH DIFFERENTIAL/PLATELET
Basophils Absolute: 0 10*3/uL (ref 0.0–0.2)
Basos: 1 %
EOS (ABSOLUTE): 0.1 10*3/uL (ref 0.0–0.4)
Eos: 2 %
Hematocrit: 43.5 % (ref 34.0–46.6)
Hemoglobin: 14.6 g/dL (ref 11.1–15.9)
Immature Grans (Abs): 0 10*3/uL (ref 0.0–0.1)
Immature Granulocytes: 0 %
Lymphocytes Absolute: 2.4 10*3/uL (ref 0.7–3.1)
Lymphs: 41 %
MCH: 29.7 pg (ref 26.6–33.0)
MCHC: 33.6 g/dL (ref 31.5–35.7)
MCV: 89 fL (ref 79–97)
Monocytes Absolute: 0.6 10*3/uL (ref 0.1–0.9)
Monocytes: 10 %
Neutrophils Absolute: 2.7 10*3/uL (ref 1.4–7.0)
Neutrophils: 46 %
Platelets: 313 10*3/uL (ref 150–450)
RBC: 4.91 x10E6/uL (ref 3.77–5.28)
RDW: 13.3 % (ref 11.7–15.4)
WBC: 5.8 10*3/uL (ref 3.4–10.8)

## 2021-04-11 LAB — TSH+FREE T4
Free T4: 1.16 ng/dL (ref 0.82–1.77)
TSH: 3.87 u[IU]/mL (ref 0.450–4.500)

## 2021-04-11 LAB — VITAMIN D 25 HYDROXY (VIT D DEFICIENCY, FRACTURES): Vit D, 25-Hydroxy: 83.4 ng/mL (ref 30.0–100.0)

## 2021-04-11 NOTE — Progress Notes (Signed)
Subjective:   Anne Morris is a 76 y.o. female who presents for Medicare Annual (Subsequent) preventive examination.  Virtual Visit via Telephone Note  I connected with  NECOLE MINASSIAN on 04/11/21 at  9:45 AM EST by telephone and verified that I am speaking with the correct person using two identifiers.  Location: Patient: Home Provider: [facility] Persons participating in the virtual visit: patient/Nurse Health Advisor   I discussed the limitations, risks, security and privacy concerns of performing an evaluation and management service by telephone and the availability of in person appointments. The patient expressed understanding and agreed to proceed.  Interactive audio and video telecommunications were attempted between this nurse and patient, however failed, due to patient having technical difficulties OR patient did not have access to video capability.  We continued and completed visit with audio only.  Some vital signs may be absent or patient reported.   Dejion Grillo E Agusta Hackenberg, LPN   Review of Systems     Cardiac Risk Factors include: advanced age (>12men, >46 women);dyslipidemia     Objective:    Today's Vitals   04/11/21 0947  Weight: 148 lb (67.1 kg)  Height: 5\' 1"  (1.549 m)   Body mass index is 27.96 kg/m.  Advanced Directives 04/11/2021  Does Patient Have a Medical Advance Directive? No  Would patient like information on creating a medical advance directive? No - Patient declined    Current Medications (verified) Outpatient Encounter Medications as of 04/11/2021  Medication Sig   Ascorbic Acid (VITAMIN C) 1000 MG tablet Take 1,000 mg by mouth daily.   Calcium 600-200 MG-UNIT tablet Take 1 tablet by mouth daily.   Cholecalciferol (VITAMIN D PO) Take 500 Int'l Units by mouth. 6 days a week   levothyroxine (SYNTHROID) 50 MCG tablet Take 1 tablet (50 mcg total) by mouth daily. (Patient not taking: Reported on 04/10/2021)   Red Yeast Rice 600 MG CAPS Take by mouth.    Facility-Administered Encounter Medications as of 04/11/2021  Medication   0.9 %  sodium chloride infusion    Allergies (verified) Erythromycin   History: Past Medical History:  Diagnosis Date   Abnormal Pap smear of cervix    ?   Allergy    seasonal   Arthritis    GERD (gastroesophageal reflux disease)    Hyperlipidemia    Osteopenia    Thyroid disease    hypothyroidism   Past Surgical History:  Procedure Laterality Date   COLONOSCOPY  07/13/10   small polyp follow in 10 years   history of iud use  in her 20's   POLYPECTOMY     SHOULDER ADHESION RELEASE Left 01/2001   TUBAL LIGATION     UPPER GASTROINTESTINAL ENDOSCOPY     Family History  Problem Relation Age of Onset   Cancer Mother        breast   Breast cancer Mother    Emphysema Father    Colon cancer Neg Hx    Pancreatic cancer Neg Hx    Esophageal cancer Neg Hx    Rectal cancer Neg Hx    Stomach cancer Neg Hx    Social History   Socioeconomic History   Marital status: Married    Spouse name: Not on file   Number of children: Not on file   Years of education: Not on file   Highest education level: Not on file  Occupational History   Occupation: retired  Tobacco Use   Smoking status: Former    Types:  Cigarettes    Quit date: 04/02/1978    Years since quitting: 43.0   Smokeless tobacco: Never  Vaping Use   Vaping Use: Never used  Substance and Sexual Activity   Alcohol use: No    Alcohol/week: 0.0 standard drinks   Drug use: No   Sexual activity: Yes    Partners: Male    Birth control/protection: Surgical    Comment: BTSP  Other Topics Concern   Not on file  Social History Narrative   Lives at home with her husband on one level   Very independent and active   Social Determinants of Radio broadcast assistant Strain: Low Risk    Difficulty of Paying Living Expenses: Not hard at all  Food Insecurity: No Food Insecurity   Worried About Charity fundraiser in the Last Year: Never true    Ran Out of Food in the Last Year: Never true  Transportation Needs: No Transportation Needs   Lack of Transportation (Medical): No   Lack of Transportation (Non-Medical): No  Physical Activity: Sufficiently Active   Days of Exercise per Week: 7 days   Minutes of Exercise per Session: 30 min  Stress: No Stress Concern Present   Feeling of Stress : Not at all  Social Connections: Socially Integrated   Frequency of Communication with Friends and Family: More than three times a week   Frequency of Social Gatherings with Friends and Family: More than three times a week   Attends Religious Services: More than 4 times per year   Active Member of [employer] or Organizations: Yes   Attends Music therapist: More than 4 times per year   Marital Status: Married    Tobacco Counseling Counseling given: Not Answered   Clinical Intake:  Pre-visit preparation completed: Yes  Pain : No/denies pain     BMI - recorded: 27.96 Nutritional Status: BMI 25 -29 Overweight Nutritional Risks: None Diabetes: No  How often do you need to have someone help you when you read instructions, pamphlets, or other written materials from your doctor or pharmacy?: 1 - Never  Diabetic? no  Interpreter Needed?: No  Information entered by :: Rylei Masella, LPN   Activities of Daily Living In your present state of health, do you have any difficulty performing the following activities: 04/11/2021  Hearing? N  Vision? N  Difficulty concentrating or making decisions? N  Walking or climbing stairs? N  Dressing or bathing? N  Doing errands, shopping? N  Preparing Food and eating ? N  Using the Toilet? N  In the past six months, have you accidently leaked urine? N  Do you have problems with loss of bowel control? N  Managing your Medications? N  Managing your Finances? N  Housekeeping or managing your Housekeeping? N  Some recent data might be hidden    Patient Care Team: Claretta Fraise, MD as  PCP - General (Family Medicine)  Indicate any recent Medical Services you may have received from other than [facility] providers in the past year (date may be approximate).     Assessment:   This is a routine wellness examination for Swan.  Hearing/Vision screen Hearing Screening - Comments:: Denies hearing difficulties Vision Screening - Comments:: Wears rx glasses - up to date with annual eye exams with [facility] in [place]  Dietary issues and exercise activities discussed: Current Exercise Habits: Home exercise routine, Type of exercise: walking;Other - see comments (yard and house work), Time (Minutes): 30, Frequency (Times/Week): 7,  Weekly Exercise (Minutes/Week): 210, Intensity: Mild, Exercise limited by: None identified   Goals Addressed             This Visit's Progress    Manage My Cholesterol       Timeframe:  Long-Range Goal Priority:  High Start Date:                             Expected End Date:                       Follow Up Date 04/12/2022    - eat smaller or less servings of red meat - fill half the plate with nonstarchy vegetables - increase the amount of fiber in food - read food labels for fat and fiber    Why is this important?   Changing cholesterol starts with eating heart-healthy foods.  Other steps may be to increase your activity and to quit if you smoke.    Notes:        Depression Screen PHQ 2/9 Scores 04/11/2021 04/10/2021 02/12/2020 10/26/2019 06/21/2017 06/20/2016 10/31/2015  PHQ - 2 Score 0 0 0 0 0 0 0    Fall Risk Fall Risk  04/11/2021 04/10/2021 02/12/2020 10/26/2019 06/21/2017  Falls in the past year? 0 0 0 0 No  Number falls in past yr: 0 - - 0 -  Injury with Fall? 0 - - 0 -  Risk for fall due to : No Fall Risks - - No Fall Risks -  Follow up Falls prevention discussed - - Falls evaluation completed -    FALL RISK PREVENTION PERTAINING TO THE HOME:  Any stairs in or around the home? No  If so, are there any without handrails? No  Home  free of loose throw rugs in walkways, pet beds, electrical cords, etc? Yes  Adequate lighting in your home to reduce risk of falls? Yes   ASSISTIVE DEVICES UTILIZED TO PREVENT FALLS:  Life alert? No  Use of a cane, walker or w/c? No  Grab bars in the bathroom? No  Shower chair or bench in shower? No  Elevated toilet seat or a handicapped toilet? No   TIMED UP AND GO:  Was the test performed? No . Telephonic visit  Cognitive Function:     6CIT Screen 04/11/2021  What Year? 0 points  What month? 0 points  What time? 0 points  Count back from 20 0 points  Months in reverse 0 points  Repeat phrase 2 points  Total Score 2    Immunizations Immunization History  Administered Date(s) Administered   Pneumococcal Conjugate-13 05/04/2013   Pneumococcal Polysaccharide-23 06/30/2010   Td 05/04/2013   Tdap 05/04/2013   Zoster, Live 12/31/2014    TDAP status: Up to date  Flu Vaccine status: Declined, Education has been provided regarding the importance of this vaccine but patient still declined. Advised may receive this vaccine at local pharmacy or Health Dept. Aware to provide a copy of the vaccination record if obtained from local pharmacy or Health Dept. Verbalized acceptance and understanding.  Pneumococcal vaccine status: Up to date  Covid-19 vaccine status: Declined, Education has been provided regarding the importance of this vaccine but patient still declined. Advised may receive this vaccine at local pharmacy or Health Dept.or vaccine clinic. Aware to provide a copy of the vaccination record if obtained from local pharmacy or Health Dept. Verbalized acceptance and understanding.  Qualifies for  Shingles Vaccine? Yes   Zostavax completed Yes   Shingrix Completed?: No.    Education has been provided regarding the importance of this vaccine. Patient has been advised to call insurance company to determine out of pocket expense if they have not yet received this vaccine. Advised  may also receive vaccine at local pharmacy or Health Dept. Verbalized acceptance and understanding.  Screening Tests Health Maintenance  Topic Date Due   COVID-19 Vaccine (1) 04/26/2021 (Originally 10/01/1945)   Zoster Vaccines- Shingrix (1 of 2) 07/09/2021 (Originally 04/04/1995)   INFLUENZA VACCINE  06/21/2022 (Originally 10/31/2020)   TETANUS/TDAP  05/05/2023   Pneumonia Vaccine 74+ Years old  Completed   DEXA SCAN  Completed   Hepatitis C Screening  Completed   HPV VACCINES  Aged Out   COLONOSCOPY (Pts 45-44yrs Insurance coverage will need to be confirmed)  Discontinued    Health Maintenance  There are no preventive care reminders to display for this patient.  Colorectal cancer screening: No longer required.   Mammogram status: No longer required due to age and declined.  Bone Density Scan: Declined  Lung Cancer Screening: (Low Dose CT Chest recommended if Age 65-80 years, 30 pack-year currently smoking OR have quit w/in 15years.) does not qualify.   Additional Screening:  Hepatitis C Screening: does qualify; Completed 06/22/2015  Vision Screening: Recommended annual ophthalmology exams for early detection of glaucoma and other disorders of the eye. Is the patient up to date with their annual eye exam?  Yes  Who is the provider or what is the name of the office in which the patient attends annual eye exams? [facility] If pt is not established with a provider, would they like to be referred to a provider to establish care? No .   Dental Screening: Recommended annual dental exams for proper oral hygiene  Community Resource Referral / Chronic Care Management: CRR required this visit?  No   CCM required this visit?  No      Plan:     I have personally reviewed and noted the following in the patients chart:   Medical and social history Use of alcohol, tobacco or illicit drugs  Current medications and supplements including opioid prescriptions.  Functional  ability and status Nutritional status Physical activity Advanced directives List of other physicians Hospitalizations, surgeries, and ER visits in previous 12 months Vitals Screenings to include cognitive, depression, and falls Referrals and appointments  In addition, I have reviewed and discussed with patient certain preventive protocols, quality metrics, and best practice recommendations. A written personalized care plan for preventive services as well as general preventive health recommendations were provided to patient.     Sandrea Hammond, LPN   [id number]/[id number]   Nurse Notes: none

## 2021-04-11 NOTE — Patient Instructions (Signed)
Anne Morris , Thank you for taking time to come for your Medicare Wellness Visit. I appreciate your ongoing commitment to your health goals. Please review the following plan we discussed and let me know if I can assist you in the future.   Screening recommendations/referrals: Colonoscopy: Done 08/17/2020 - no repeat Mammogram: Done 2019 - declines repeat Bone Density: Declined Recommended yearly ophthalmology/optometry visit for glaucoma screening and checkup Recommended yearly dental visit for hygiene and checkup  Vaccinations: Influenza vaccine: Declined Pneumococcal vaccine: Done 06/30/2010 & 05/04/2013 Tdap vaccine: Done 05/04/2013 - Repeat in 10 years Shingles vaccine: Zostavax done 2016 - due for Shingrix   Covid-19: Declined  Advanced directives: Please bring a copy of your health care power of attorney and living will to the office to be added to your chart at your convenience.   Conditions/risks identified: Aim for 30 minutes of exercise or brisk walking each day, drink 6-8 glasses of water and eat lots of fruits and vegetables.   Next appointment: Follow up in one year for your annual wellness visit    Preventive Care 65 Years and Older, Female Preventive care refers to lifestyle choices and visits with your health care provider that can promote health and wellness. What does preventive care include? A yearly physical exam. This is also called an annual well check. Dental exams once or twice a year. Routine eye exams. Ask your health care provider how often you should have your eyes checked. Personal lifestyle choices, including: Daily care of your teeth and gums. Regular physical activity. Eating a healthy diet. Avoiding tobacco and drug use. Limiting alcohol use. Practicing safe sex. Taking low-dose aspirin every day. Taking vitamin and mineral supplements as recommended by your health care provider. What happens during an annual well check? The services and screenings  done by your health care provider during your annual well check will depend on your age, overall health, lifestyle risk factors, and family history of disease. Counseling  Your health care provider may ask you questions about your: Alcohol use. Tobacco use. Drug use. Emotional well-being. Home and relationship well-being. Sexual activity. Eating habits. History of falls. Memory and ability to understand (cognition). Work and work Statistician. Reproductive health. Screening  You may have the following tests or measurements: Height, weight, and BMI. Blood pressure. Lipid and cholesterol levels. These may be checked every 5 years, or more frequently if you are over 20 years old. Skin check. Lung cancer screening. You may have this screening every year starting at age 57 if you have a 30-pack-year history of smoking and currently smoke or have quit within the past 15 years. Fecal occult blood test (FOBT) of the stool. You may have this test every year starting at age 9. Flexible sigmoidoscopy or colonoscopy. You may have a sigmoidoscopy every 5 years or a colonoscopy every 10 years starting at age 44. Hepatitis C blood test. Hepatitis B blood test. Sexually transmitted disease (STD) testing. Diabetes screening. This is done by checking your blood sugar (glucose) after you have not eaten for a while (fasting). You may have this done every 1-3 years. Bone density scan. This is done to screen for osteoporosis. You may have this done starting at age 71. Mammogram. This may be done every 1-2 years. Talk to your health care provider about how often you should have regular mammograms. Talk with your health care provider about your test results, treatment options, and if necessary, the need for more tests. Vaccines  Your health care provider may  recommend certain vaccines, such as: Influenza vaccine. This is recommended every year. Tetanus, diphtheria, and acellular pertussis (Tdap, Td)  vaccine. You may need a Td booster every 10 years. Zoster vaccine. You may need this after age 30. Pneumococcal 13-valent conjugate (PCV13) vaccine. One dose is recommended after age 98. Pneumococcal polysaccharide (PPSV23) vaccine. One dose is recommended after age 8. Talk to your health care provider about which screenings and vaccines you need and how often you need them. This information is not intended to replace advice given to you by your health care provider. Make sure you discuss any questions you have with your health care provider. Document Released: 04/15/2015 Document Revised: 12/07/2015 Document Reviewed: 01/18/2015 Elsevier Interactive Patient Education  2017 [employer] Prevention in the Home Falls can cause injuries. They can happen to people of all ages. There are many things you can do to make your home safe and to help prevent falls. What can I do on the outside of my home? Regularly fix the edges of walkways and driveways and fix any cracks. Remove anything that might make you trip as you walk through a door, such as a raised step or threshold. Trim any bushes or trees on the path to your home. Use bright outdoor lighting. Clear any walking paths of anything that might make someone trip, such as rocks or tools. Regularly check to see if handrails are loose or broken. Make sure that both sides of any steps have handrails. Any raised decks and porches should have guardrails on the edges. Have any leaves, snow, or ice cleared regularly. Use sand or salt on walking paths during winter. Clean up any spills in your garage right away. This includes oil or grease spills. What can I do in the bathroom? Use night lights. Install grab bars by the toilet and in the tub and shower. Do not use towel bars as grab bars. Use non-skid mats or decals in the tub or shower. If you need to sit down in the shower, use a plastic, non-slip stool. Keep the floor dry. Clean up any  water that spills on the floor as soon as it happens. Remove soap buildup in the tub or shower regularly. Attach bath mats securely with double-sided non-slip rug tape. Do not have throw rugs and other things on the floor that can make you trip. What can I do in the bedroom? Use night lights. Make sure that you have a light by your bed that is easy to reach. Do not use any sheets or blankets that are too big for your bed. They should not hang down onto the floor. Have a firm chair that has side arms. You can use this for support while you get dressed. Do not have throw rugs and other things on the floor that can make you trip. What can I do in the kitchen? Clean up any spills right away. Avoid walking on wet floors. Keep items that you use a lot in easy-to-reach places. If you need to reach something above you, use a strong step stool that has a grab bar. Keep electrical cords out of the way. Do not use floor polish or wax that makes floors slippery. If you must use wax, use non-skid floor wax. Do not have throw rugs and other things on the floor that can make you trip. What can I do with my stairs? Do not leave any items on the stairs. Make sure that there are handrails on both sides of  the stairs and use them. Fix handrails that are broken or loose. Make sure that handrails are as long as the stairways. Check any carpeting to make sure that it is firmly attached to the stairs. Fix any carpet that is loose or worn. Avoid having throw rugs at the top or bottom of the stairs. If you do have throw rugs, attach them to the floor with carpet tape. Make sure that you have a light switch at the top of the stairs and the bottom of the stairs. If you do not have them, ask someone to add them for you. What else can I do to help prevent falls? Wear shoes that: Do not have high heels. Have rubber bottoms. Are comfortable and fit you well. Are closed at the toe. Do not wear sandals. If you use a  stepladder: Make sure that it is fully opened. Do not climb a closed stepladder. Make sure that both sides of the stepladder are locked into place. Ask someone to hold it for you, if possible. Clearly mark and make sure that you can see: Any grab bars or handrails. First and last steps. Where the edge of each step is. Use tools that help you move around (mobility aids) if they are needed. These include: Canes. Walkers. Scooters. Crutches. Turn on the lights when you go into a dark area. Replace any light bulbs as soon as they burn out. Set up your furniture so you have a clear path. Avoid moving your furniture around. If any of your floors are uneven, fix them. If there are any pets around you, be aware of where they are. Review your medicines with your doctor. Some medicines can make you feel dizzy. This can increase your chance of falling. Ask your doctor what other things that you can do to help prevent falls. This information is not intended to replace advice given to you by your health care provider. Make sure you discuss any questions you have with your health care provider. Document Released: 01/13/2009 Document Revised: 08/25/2015 Document Reviewed: 04/23/2014 Elsevier Interactive Patient Education  2017 [employer].

## 2021-04-14 ENCOUNTER — Other Ambulatory Visit: Payer: Self-pay | Admitting: *Deleted

## 2021-04-14 DIAGNOSIS — E039 Hypothyroidism, unspecified: Secondary | ICD-10-CM

## 2021-04-14 MED ORDER — LEVOTHYROXINE SODIUM 50 MCG PO TABS: 50.0000 ug | ORAL_TABLET | Freq: Every day | ORAL | 3 refills | Status: DC

## 2021-06-08 ENCOUNTER — Ambulatory Visit: Payer: Medicare HMO | Admitting: Family Medicine

## 2022-04-13 ENCOUNTER — Encounter: Payer: Self-pay | Admitting: Family Medicine

## 2022-04-16 ENCOUNTER — Ambulatory Visit (INDEPENDENT_AMBULATORY_CARE_PROVIDER_SITE_OTHER): Payer: Medicare HMO | Admitting: Family Medicine

## 2022-04-16 ENCOUNTER — Encounter: Payer: Self-pay | Admitting: Family Medicine

## 2022-04-16 VITALS — BP 159/63 | HR 68 | Temp 97.8°F | Ht 61.0 in | Wt 147.4 lb

## 2022-04-16 DIAGNOSIS — E039 Hypothyroidism, unspecified: Secondary | ICD-10-CM

## 2022-04-16 DIAGNOSIS — R030 Elevated blood-pressure reading, without diagnosis of hypertension: Secondary | ICD-10-CM

## 2022-04-16 DIAGNOSIS — E559 Vitamin D deficiency, unspecified: Secondary | ICD-10-CM

## 2022-04-16 DIAGNOSIS — E782 Mixed hyperlipidemia: Secondary | ICD-10-CM

## 2022-04-16 MED ORDER — LEVOTHYROXINE SODIUM 50 MCG PO TABS: 50.0000 ug | ORAL_TABLET | Freq: Every day | ORAL | 3 refills | Status: AC

## 2022-04-16 NOTE — Progress Notes (Signed)
Subjective:  Patient ID: Anne Morris, female    DOB: 1945/09/17  Age: 77 y.o. MRN: [id number]  CC: Medical Management of Chronic Issues   HPI CASIDEE JANN presents for follow-up of elevated cholesterol. Taking krill oil daily. Dced  the Red Yeast rice. Declines the DEXA.   follow-up on  thyroid. The patient has a history of hypothyroidism for many years. It has been stable recently. Pt. denies any change in  voice, loss of hair, heat or cold intolerance. Energy level has been adequate to good. Patient denies constipation and diarrhea. No myxedema. Medication is as noted below. Verified that pt is taking it daily on an empty stomach. Well tolerated.  Pt  has elevated BP.  Wants to treat with salt avoidance.  History Cherese has a past medical history of Abnormal Pap smear of cervix, Allergy, Arthritis, GERD (gastroesophageal reflux disease), Hyperlipidemia, Osteopenia, and Thyroid disease.   She has a past surgical history that includes Shoulder adhesion release (Left, 01/2001); Colonoscopy (07/13/10); Polypectomy; Tubal ligation; history of iud use (in her 20's); and Upper gastrointestinal endoscopy.   Her family history includes Breast cancer in her mother; Cancer in her mother; Emphysema in her father.She reports that she quit smoking about 44 years ago. Her smoking use included cigarettes. She has never used smokeless tobacco. She reports that she does not drink alcohol and does not use drugs.  Current Outpatient Medications on File Prior to Visit  Medication Sig Dispense Refill   Ascorbic Acid (VITAMIN C) 1000 MG tablet Take 1,000 mg by mouth daily.     Calcium 600-200 MG-UNIT tablet Take 1 tablet by mouth daily.     Cholecalciferol (VITAMIN D PO) Take 500 Int'l Units by mouth. 6 days a week     Current Facility-Administered Medications on File Prior to Visit  Medication Dose Route Frequency Provider Last Rate Last Admin   0.9 %  sodium chloride infusion  500 mL Intravenous Once  Ladene Artist, MD        ROS Review of Systems  Constitutional: Negative.   HENT: Negative.    Eyes:  Negative for visual disturbance.  Respiratory:  Negative for shortness of breath.   Cardiovascular:  Negative for chest pain.  Gastrointestinal:  Negative for abdominal pain.  Musculoskeletal:  Negative for arthralgias.    Objective:  BP (!) 159/63   Pulse 68   Temp 97.8 F (36.6 C)   Ht '5\' 1"'$  (1.549 m)   Wt 147 lb 6.4 oz (66.9 kg)   LMP 10/08/2007 (Exact Date)   SpO2 97%   BMI 27.85 kg/m   BP Readings from Last 3 Encounters:  04/16/22 (!) 159/63  04/10/21 (!) 159/67  08/17/20 (!) 119/47    Wt Readings from Last 3 Encounters:  04/16/22 147 lb 6.4 oz (66.9 kg)  04/11/21 148 lb (67.1 kg)  04/10/21 148 lb 3.2 oz (67.2 kg)     Physical Exam Constitutional:      General: She is not in acute distress.    Appearance: She is well-developed.  Cardiovascular:     Rate and Rhythm: Normal rate and regular rhythm.  Pulmonary:     Breath sounds: Normal breath sounds.  Musculoskeletal:        General: Normal range of motion.  Skin:    General: Skin is warm and dry.  Neurological:     Mental Status: She is alert and oriented to person, place, and time.     No results found for: "  HGBA1C"  Lab Results  Component Value Date   WBC 5.8 04/10/2021   HGB 14.6 04/10/2021   HCT 43.5 04/10/2021   PLT 313 04/10/2021   GLUCOSE 97 04/10/2021   CHOL 222 (H) 04/10/2021   TRIG 170 (H) 04/10/2021   HDL 47 04/10/2021   LDLCALC 144 (H) 04/10/2021   ALT 27 04/10/2021   AST 29 04/10/2021   NA 144 04/10/2021   K 4.8 04/10/2021   CL 107 (H) 04/10/2021   CREATININE 0.84 04/10/2021   BUN 20 04/10/2021   CO2 21 04/10/2021   TSH 3.870 04/10/2021    No results found.  Assessment & Plan:   Kacelyn was seen today for medical management of chronic issues.  Diagnoses and all orders for this visit:  Acquired hypothyroidism -     TSH + free T4 -     levothyroxine (SYNTHROID)  50 MCG tablet; Take 1 tablet (50 mcg total) by mouth daily.  Vitamin D deficiency -     VITAMIN D 25 Hydroxy (Vit-D Deficiency, Fractures)  Mixed hyperlipidemia -     Lipid panel  Elevated BP without diagnosis of hypertension   I have discontinued Cyndy Freeze "Judy"'s Red Yeast Rice. I am also having her maintain her Cholecalciferol (VITAMIN D PO), Calcium, vitamin C, and levothyroxine. We will continue to administer sodium chloride.  Meds ordered this encounter  Medications   levothyroxine (SYNTHROID) 50 MCG tablet    Sig: Take 1 tablet (50 mcg total) by mouth daily.    Dispense:  90 tablet    Refill:  3     Follow-up: Return in about 6 months (around 10/15/2022).  Claretta Fraise, M.D.

## 2022-04-17 ENCOUNTER — Ambulatory Visit: Payer: Medicare HMO | Admitting: Family Medicine

## 2022-04-17 LAB — LIPID PANEL
Chol/HDL Ratio: 4 ratio (ref 0.0–4.4)
Cholesterol, Total: 198 mg/dL (ref 100–199)
HDL: 49 mg/dL (ref >39)
LDL Chol Calc (NIH): 128 mg/dL — ABNORMAL HIGH (ref 0–99)
Triglycerides: 118 mg/dL (ref 0–149)
VLDL Cholesterol Cal: 21 mg/dL (ref 5–40)

## 2022-04-17 LAB — TSH+FREE T4
Free T4: 1.28 ng/dL (ref 0.82–1.77)
TSH: 2.35 u[IU]/mL (ref 0.450–4.500)

## 2022-04-17 LAB — VITAMIN D 25 HYDROXY (VIT D DEFICIENCY, FRACTURES): Vit D, 25-Hydroxy: 74.2 ng/mL (ref 30.0–100.0)

## 2022-04-17 NOTE — Progress Notes (Signed)
Hello Aideliz,  Your lab result is normal and/or stable.Some minor variations that are not significant are commonly marked abnormal, but do not represent any medical problem for you.  Best regards, Claretta Fraise, M.D.

## 2022-05-23 ENCOUNTER — Ambulatory Visit (INDEPENDENT_AMBULATORY_CARE_PROVIDER_SITE_OTHER): Payer: Medicare HMO

## 2022-05-23 VITALS — Ht 62.0 in | Wt 147.0 lb

## 2022-05-23 DIAGNOSIS — Z0000 Encounter for general adult medical examination without abnormal findings: Secondary | ICD-10-CM | POA: Diagnosis not present

## 2022-05-23 NOTE — Patient Instructions (Signed)
[name] , Thank you for taking time to come for your Medicare Wellness Visit. I appreciate your ongoing commitment to your health goals. Please review the following plan we discussed and let me know if I can assist you in the future.   These are the goals we discussed:  Goals      Exercise 3x per week (30 min per time)     Manage My Cholesterol     Timeframe:  Long-Range Goal Priority:  High Start Date:                             Expected End Date:                       Follow Up Date 04/12/2022    - eat smaller or less servings of red meat - fill half the plate with nonstarchy vegetables - increase the amount of fiber in food - read food labels for fat and fiber    Why is this important?   Changing cholesterol starts with eating heart-healthy foods.  Other steps may be to increase your activity and to quit if you smoke.    Notes:         This is a list of the screening recommended for you and due dates:  Health Maintenance  Topic Date Due   COVID-19 Vaccine (1) Never done   Flu Shot  07/01/2022*   Zoster (Shingles) Vaccine (1 of 2) 07/16/2022*   DTaP/Tdap/Td vaccine (3 - Td or Tdap) 05/05/2023   Medicare Annual Wellness Visit  05/24/2023   Pneumonia Vaccine  Completed   DEXA scan (bone density measurement)  Completed   Hepatitis C Screening: USPSTF Recommendation to screen - Ages 25-79 yo.  Completed   HPV Vaccine  Aged Out   Colon Cancer Screening  Discontinued  *Topic was postponed. The date shown is not the original due date.    Advanced directives: [name] , Thank you for taking time to come for your Medicare Wellness Visit. I appreciate your ongoing commitment to your health goals. Please review the following plan we discussed and let me know if I can assist you in the future.   These are the goals we discussed:  Goals      Exercise 3x per week (30 min per time)     Manage My Cholesterol     Timeframe:  Long-Range Goal Priority:  High Start Date:                              Expected End Date:                       Follow Up Date 04/12/2022    - eat smaller or less servings of red meat - fill half the plate with nonstarchy vegetables - increase the amount of fiber in food - read food labels for fat and fiber    Why is this important?   Changing cholesterol starts with eating heart-healthy foods.  Other steps may be to increase your activity and to quit if you smoke.    Notes:         This is a list of the screening recommended for you and due dates:  Health Maintenance  Topic Date Due   COVID-19 Vaccine (1) Never done   Flu Shot  07/01/2022*  Zoster (Shingles) Vaccine (1 of 2) 07/16/2022*   DTaP/Tdap/Td vaccine (3 - Td or Tdap) 05/05/2023   Medicare Annual Wellness Visit  05/24/2023   Pneumonia Vaccine  Completed   DEXA scan (bone density measurement)  Completed   Hepatitis C Screening: USPSTF Recommendation to screen - Ages 62-79 yo.  Completed   HPV Vaccine  Aged Out   Colon Cancer Screening  Discontinued  *Topic was postponed. The date shown is not the original due date.    Advanced directives: Advance directive discussed with you today. I have provided a copy for you to complete at home and have notarized. Once this is complete please bring a copy in to our office so we can scan it into your chart.   Conditions/risks identified: Aim for 30 minutes of exercise or brisk walking, 6-8 glasses of water, and 5 servings of fruits and vegetables each day.   Next appointment: Follow up in one year for your annual wellness visit    Preventive Care 65 Years and Older, Female Preventive care refers to lifestyle choices and visits with your health care provider that can promote health and wellness. What does preventive care include? A yearly physical exam. This is also called an annual well check. Dental exams once or twice a year. Routine eye exams. Ask your health care provider how often you should have your eyes  checked. Personal lifestyle choices, including: Daily care of your teeth and gums. Regular physical activity. Eating a healthy diet. Avoiding tobacco and drug use. Limiting alcohol use. Practicing safe sex. Taking low-dose aspirin every day. Taking vitamin and mineral supplements as recommended by your health care provider. What happens during an annual well check? The services and screenings done by your health care provider during your annual well check will depend on your age, overall health, lifestyle risk factors, and family history of disease. Counseling  Your health care provider may ask you questions about your: Alcohol use. Tobacco use. Drug use. Emotional well-being. Home and relationship well-being. Sexual activity. Eating habits. History of falls. Memory and ability to understand (cognition). Work and work Statistician. Reproductive health. Screening  You may have the following tests or measurements: Height, weight, and BMI. Blood pressure. Lipid and cholesterol levels. These may be checked every 5 years, or more frequently if you are over 74 years old. Skin check. Lung cancer screening. You may have this screening every year starting at age 29 if you have a 30-pack-year history of smoking and currently smoke or have quit within the past 15 years. Fecal occult blood test (FOBT) of the stool. You may have this test every year starting at age 55. Flexible sigmoidoscopy or colonoscopy. You may have a sigmoidoscopy every 5 years or a colonoscopy every 10 years starting at age 69. Hepatitis C blood test. Hepatitis B blood test. Sexually transmitted disease (STD) testing. Diabetes screening. This is done by checking your blood sugar (glucose) after you have not eaten for a while (fasting). You may have this done every 1-3 years. Bone density scan. This is done to screen for osteoporosis. You may have this done starting at age 87. Mammogram. This may be done every 1-2  years. Talk to your health care provider about how often you should have regular mammograms. Talk with your health care provider about your test results, treatment options, and if necessary, the need for more tests. Vaccines  Your health care provider may recommend certain vaccines, such as: Influenza vaccine. This is recommended every year. Tetanus, diphtheria,  and acellular pertussis (Tdap, Td) vaccine. You may need a Td booster every 10 years. Zoster vaccine. You may need this after age 74. Pneumococcal 13-valent conjugate (PCV13) vaccine. One dose is recommended after age 54. Pneumococcal polysaccharide (PPSV23) vaccine. One dose is recommended after age 89. Talk to your health care provider about which screenings and vaccines you need and how often you need them. This information is not intended to replace advice given to you by your health care provider. Make sure you discuss any questions you have with your health care provider. Document Released: 04/15/2015 Document Revised: 12/07/2015 Document Reviewed: 01/18/2015 Elsevier Interactive Patient Education  2017 [place] Prevention in the Home Falls can cause injuries. They can happen to people of all ages. There are many things you can do to make your home safe and to help prevent falls. What can I do on the outside of my home? Regularly fix the edges of walkways and driveways and fix any cracks. Remove anything that might make you trip as you walk through a door, such as a raised step or threshold. Trim any bushes or trees on the path to your home. Use bright outdoor lighting. Clear any walking paths of anything that might make someone trip, such as rocks or tools. Regularly check to see if handrails are loose or broken. Make sure that both sides of any steps have handrails. Any raised decks and porches should have guardrails on the edges. Have any leaves, snow, or ice cleared regularly. Use sand or salt on walking paths  during winter. Clean up any spills in your garage right away. This includes oil or grease spills. What can I do in the bathroom? Use night lights. Install grab bars by the toilet and in the tub and shower. Do not use towel bars as grab bars. Use non-skid mats or decals in the tub or shower. If you need to sit down in the shower, use a plastic, non-slip stool. Keep the floor dry. Clean up any water that spills on the floor as soon as it happens. Remove soap buildup in the tub or shower regularly. Attach bath mats securely with double-sided non-slip rug tape. Do not have throw rugs and other things on the floor that can make you trip. What can I do in the bedroom? Use night lights. Make sure that you have a light by your bed that is easy to reach. Do not use any sheets or blankets that are too big for your bed. They should not hang down onto the floor. Have a firm chair that has side arms. You can use this for support while you get dressed. Do not have throw rugs and other things on the floor that can make you trip. What can I do in the kitchen? Clean up any spills right away. Avoid walking on wet floors. Keep items that you use a lot in easy-to-reach places. If you need to reach something above you, use a strong step stool that has a grab bar. Keep electrical cords out of the way. Do not use floor polish or wax that makes floors slippery. If you must use wax, use non-skid floor wax. Do not have throw rugs and other things on the floor that can make you trip. What can I do with my stairs? Do not leave any items on the stairs. Make sure that there are handrails on both sides of the stairs and use them. Fix handrails that are broken or loose. Make sure that  handrails are as long as the stairways. Check any carpeting to make sure that it is firmly attached to the stairs. Fix any carpet that is loose or worn. Avoid having throw rugs at the top or bottom of the stairs. If you do have throw  rugs, attach them to the floor with carpet tape. Make sure that you have a light switch at the top of the stairs and the bottom of the stairs. If you do not have them, ask someone to add them for you. What else can I do to help prevent falls? Wear shoes that: Do not have high heels. Have rubber bottoms. Are comfortable and fit you well. Are closed at the toe. Do not wear sandals. If you use a stepladder: Make sure that it is fully opened. Do not climb a closed stepladder. Make sure that both sides of the stepladder are locked into place. Ask someone to hold it for you, if possible. Clearly mark and make sure that you can see: Any grab bars or handrails. First and last steps. Where the edge of each step is. Use tools that help you move around (mobility aids) if they are needed. These include: Canes. Walkers. Scooters. Crutches. Turn on the lights when you go into a dark area. Replace any light bulbs as soon as they burn out. Set up your furniture so you have a clear path. Avoid moving your furniture around. If any of your floors are uneven, fix them. If there are any pets around you, be aware of where they are. Review your medicines with your doctor. Some medicines can make you feel dizzy. This can increase your chance of falling. Ask your doctor what other things that you can do to help prevent falls. This information is not intended to replace advice given to you by your health care provider. Make sure you discuss any questions you have with your health care provider. Document Released: 01/13/2009 Document Revised: 08/25/2015 Document Reviewed: 04/23/2014 Elsevier Interactive Patient Education  2017 [place].   Conditions/risks identified: Aim for 30 minutes of exercise or brisk walking, 6-8 glasses of water, and 5 servings of fruits and vegetables each day.   Next appointment: Follow up in one year for your annual wellness visit    Preventive Care 65 Years and Older,  Female Preventive care refers to lifestyle choices and visits with your health care provider that can promote health and wellness. What does preventive care include? A yearly physical exam. This is also called an annual well check. Dental exams once or twice a year. Routine eye exams. Ask your health care provider how often you should have your eyes checked. Personal lifestyle choices, including: Daily care of your teeth and gums. Regular physical activity. Eating a healthy diet. Avoiding tobacco and drug use. Limiting alcohol use. Practicing safe sex. Taking low-dose aspirin every day. Taking vitamin and mineral supplements as recommended by your health care provider. What happens during an annual well check? The services and screenings done by your health care provider during your annual well check will depend on your age, overall health, lifestyle risk factors, and family history of disease. Counseling  Your health care provider may ask you questions about your: Alcohol use. Tobacco use. Drug use. Emotional well-being. Home and relationship well-being. Sexual activity. Eating habits. History of falls. Memory and ability to understand (cognition). Work and work Statistician. Reproductive health. Screening  You may have the following tests or measurements: Height, weight, and BMI. Blood pressure. Lipid and cholesterol  levels. These may be checked every 5 years, or more frequently if you are over 85 years old. Skin check. Lung cancer screening. You may have this screening every year starting at age 4 if you have a 30-pack-year history of smoking and currently smoke or have quit within the past 15 years. Fecal occult blood test (FOBT) of the stool. You may have this test every year starting at age 44. Flexible sigmoidoscopy or colonoscopy. You may have a sigmoidoscopy every 5 years or a colonoscopy every 10 years starting at age 66. Hepatitis C blood test. Hepatitis B blood  test. Sexually transmitted disease (STD) testing. Diabetes screening. This is done by checking your blood sugar (glucose) after you have not eaten for a while (fasting). You may have this done every 1-3 years. Bone density scan. This is done to screen for osteoporosis. You may have this done starting at age 16. Mammogram. This may be done every 1-2 years. Talk to your health care provider about how often you should have regular mammograms. Talk with your health care provider about your test results, treatment options, and if necessary, the need for more tests. Vaccines  Your health care provider may recommend certain vaccines, such as: Influenza vaccine. This is recommended every year. Tetanus, diphtheria, and acellular pertussis (Tdap, Td) vaccine. You may need a Td booster every 10 years. Zoster vaccine. You may need this after age 3. Pneumococcal 13-valent conjugate (PCV13) vaccine. One dose is recommended after age 59. Pneumococcal polysaccharide (PPSV23) vaccine. One dose is recommended after age 83. Talk to your health care provider about which screenings and vaccines you need and how often you need them. This information is not intended to replace advice given to you by your health care provider. Make sure you discuss any questions you have with your health care provider. Document Released: 04/15/2015 Document Revised: 12/07/2015 Document Reviewed: 01/18/2015 Elsevier Interactive Patient Education  2017 [place] Prevention in the Home Falls can cause injuries. They can happen to people of all ages. There are many things you can do to make your home safe and to help prevent falls. What can I do on the outside of my home? Regularly fix the edges of walkways and driveways and fix any cracks. Remove anything that might make you trip as you walk through a door, such as a raised step or threshold. Trim any bushes or trees on the path to your home. Use bright outdoor  lighting. Clear any walking paths of anything that might make someone trip, such as rocks or tools. Regularly check to see if handrails are loose or broken. Make sure that both sides of any steps have handrails. Any raised decks and porches should have guardrails on the edges. Have any leaves, snow, or ice cleared regularly. Use sand or salt on walking paths during winter. Clean up any spills in your garage right away. This includes oil or grease spills. What can I do in the bathroom? Use night lights. Install grab bars by the toilet and in the tub and shower. Do not use towel bars as grab bars. Use non-skid mats or decals in the tub or shower. If you need to sit down in the shower, use a plastic, non-slip stool. Keep the floor dry. Clean up any water that spills on the floor as soon as it happens. Remove soap buildup in the tub or shower regularly. Attach bath mats securely with double-sided non-slip rug tape. Do not have throw rugs and other things  on the floor that can make you trip. What can I do in the bedroom? Use night lights. Make sure that you have a light by your bed that is easy to reach. Do not use any sheets or blankets that are too big for your bed. They should not hang down onto the floor. Have a firm chair that has side arms. You can use this for support while you get dressed. Do not have throw rugs and other things on the floor that can make you trip. What can I do in the kitchen? Clean up any spills right away. Avoid walking on wet floors. Keep items that you use a lot in easy-to-reach places. If you need to reach something above you, use a strong step stool that has a grab bar. Keep electrical cords out of the way. Do not use floor polish or wax that makes floors slippery. If you must use wax, use non-skid floor wax. Do not have throw rugs and other things on the floor that can make you trip. What can I do with my stairs? Do not leave any items on the stairs. Make  sure that there are handrails on both sides of the stairs and use them. Fix handrails that are broken or loose. Make sure that handrails are as long as the stairways. Check any carpeting to make sure that it is firmly attached to the stairs. Fix any carpet that is loose or worn. Avoid having throw rugs at the top or bottom of the stairs. If you do have throw rugs, attach them to the floor with carpet tape. Make sure that you have a light switch at the top of the stairs and the bottom of the stairs. If you do not have them, ask someone to add them for you. What else can I do to help prevent falls? Wear shoes that: Do not have high heels. Have rubber bottoms. Are comfortable and fit you well. Are closed at the toe. Do not wear sandals. If you use a stepladder: Make sure that it is fully opened. Do not climb a closed stepladder. Make sure that both sides of the stepladder are locked into place. Ask someone to hold it for you, if possible. Clearly mark and make sure that you can see: Any grab bars or handrails. First and last steps. Where the edge of each step is. Use tools that help you move around (mobility aids) if they are needed. These include: Canes. Walkers. Scooters. Crutches. Turn on the lights when you go into a dark area. Replace any light bulbs as soon as they burn out. Set up your furniture so you have a clear path. Avoid moving your furniture around. If any of your floors are uneven, fix them. If there are any pets around you, be aware of where they are. Review your medicines with your doctor. Some medicines can make you feel dizzy. This can increase your chance of falling. Ask your doctor what other things that you can do to help prevent falls. This information is not intended to replace advice given to you by your health care provider. Make sure you discuss any questions you have with your health care provider. Document Released: 01/13/2009 Document Revised: 08/25/2015  Document Reviewed: 04/23/2014 Elsevier Interactive Patient Education  2017 [employer].

## 2022-05-23 NOTE — Progress Notes (Signed)
Subjective:   Anne Morris is a 77 y.o. female who presents for Medicare Annual (Subsequent) preventive examination. I connected with  Anne Morris on 05/23/22 by a audio enabled telemedicine application and verified that I am speaking with the correct person using two identifiers.  Patient Location: Home  Provider Location: Home Office  I discussed the limitations of evaluation and management by telemedicine. The patient expressed understanding and agreed to proceed.  Review of Systems     Cardiac Risk Factors include: advanced age (>45mn, >>52women)     Objective:    Today's Vitals   05/23/22 1428  Weight: 147 lb (66.7 kg)  Height: 5' 2"$  (1.575 m)   Body mass index is 26.89 kg/m.     05/23/2022    2:31 PM 04/11/2021   10:10 AM  Advanced Directives  Does Patient Have a Medical Advance Directive? Yes No  Type of AParamedicof A[place]Living will   Copy of [place]? No - copy requested   Would patient like information on creating a medical advance directive?  No - Patient declined    Current Medications (verified) Outpatient Encounter Medications as of 05/23/2022  Medication Sig   Ascorbic Acid (VITAMIN C) 1000 MG tablet Take 1,000 mg by mouth daily.   Calcium 600-200 MG-UNIT tablet Take 1 tablet by mouth daily.   Cholecalciferol (VITAMIN D PO) Take 500 Int'l Units by mouth. 6 days a week   levothyroxine (SYNTHROID) 50 MCG tablet Take 1 tablet (50 mcg total) by mouth daily.   Facility-Administered Encounter Medications as of 05/23/2022  Medication   0.9 %  sodium chloride infusion    Allergies (verified) Erythromycin   History: Past Medical History:  Diagnosis Date   Abnormal Pap smear of cervix    ?   Allergy    seasonal   Arthritis    GERD (gastroesophageal reflux disease)    Hyperlipidemia    Osteopenia    Thyroid disease    hypothyroidism   Past Surgical History:  Procedure Laterality Date    COLONOSCOPY  07/13/10   small polyp follow in 10 years   history of iud use  in her 20's   POLYPECTOMY     SHOULDER ADHESION RELEASE Left 01/2001   TUBAL LIGATION     UPPER GASTROINTESTINAL ENDOSCOPY     Family History  Problem Relation Age of Onset   Cancer Mother        breast   Breast cancer Mother    Emphysema Father    Colon cancer Neg Hx    Pancreatic cancer Neg Hx    Esophageal cancer Neg Hx    Rectal cancer Neg Hx    Stomach cancer Neg Hx    Social History   Socioeconomic History   Marital status: Married    Spouse name: Not on file   Number of children: Not on file   Years of education: Not on file   Highest education level: Not on file  Occupational History   Occupation: retired  Tobacco Use   Smoking status: Former    Types: Cigarettes    Quit date: 04/02/1978    Years since quitting: 44.1   Smokeless tobacco: Never  Vaping Use   Vaping Use: Never used  Substance and Sexual Activity   Alcohol use: No    Alcohol/week: 0.0 standard drinks of alcohol   Drug use: No   Sexual activity: Yes    Partners: Male  Birth control/protection: Surgical    Comment: BTSP  Other Topics Concern   Not on file  Social History Narrative   Lives at home with her husband on one level   Very independent and active   Social Determinants of Health   Financial Resource Strain: Low Risk  (05/23/2022)   Overall Financial Resource Strain (CARDIA)    Difficulty of Paying Living Expenses: Not hard at all  Food Insecurity: No Food Insecurity (05/23/2022)   Hunger Vital Sign    Worried About Running Out of Food in the Last Year: Never true    Ran Out of Food in the Last Year: Never true  Transportation Needs: No Transportation Needs (05/23/2022)   PRAPARE - Hydrologist (Medical): No    Lack of Transportation (Non-Medical): No  Physical Activity: Insufficiently Active (05/23/2022)   Exercise Vital Sign    Days of Exercise per Week: 3 days     Minutes of Exercise per Session: 30 min  Stress: No Stress Concern Present (05/23/2022)   [employer]    Feeling of Stress : Not at all  Social Connections: [place] (05/23/2022)   Social Connection and Isolation Panel [NHANES]    Frequency of Communication with Friends and Family: More than three times a week    Frequency of Social Gatherings with Friends and Family: More than three times a week    Attends Religious Services: More than 4 times per year    Active Member of [employer] or Organizations: Yes    Attends Music therapist: More than 4 times per year    Marital Status: Married    Tobacco Counseling Counseling given: Not Answered   Clinical Intake:  Pre-visit preparation completed: Yes  Pain : No/denies pain     Nutritional Risks: None Diabetes: No  How often do you need to have someone help you when you read instructions, pamphlets, or other written materials from your doctor or pharmacy?: 1 - Never  Diabetic?no   Interpreter Needed?: No  Information entered by :: Jadene Pierini, LPN   Activities of Daily Living    05/23/2022    2:31 PM  In your present state of health, do you have any difficulty performing the following activities:  Hearing? 0  Vision? 0  Difficulty concentrating or making decisions? 0  Walking or climbing stairs? 0  Dressing or bathing? 0  Doing errands, shopping? 0  Preparing Food and eating ? N  Using the Toilet? N  In the past six months, have you accidently leaked urine? N  Do you have problems with loss of bowel control? N  Managing your Medications? N  Managing your Finances? N  Housekeeping or managing your Housekeeping? N    Patient Care Team: Claretta Fraise, MD as PCP - General (Family Medicine)  Indicate any recent Medical Services you may have received from other than [facility] providers in the past year (date may be approximate).      Assessment:   This is a routine wellness examination for Anne Morris.  Hearing/Vision screen Vision Screening - Comments:: Wears rx glasses - up to date with routine eye exams with  Dr.Lee   Dietary issues and exercise activities discussed: Current Exercise Habits: Home exercise routine, Type of exercise: walking, Time (Minutes): 30, Frequency (Times/Week): 3, Weekly Exercise (Minutes/Week): 90, Intensity: Mild, Exercise limited by: None identified   Goals Addressed  This Visit's Progress    Exercise 3x per week (30 min per time)         Depression Screen    05/23/2022    2:30 PM 04/16/2022    9:00 AM 04/11/2021    9:49 AM 04/10/2021    9:04 AM 02/12/2020   11:14 AM 10/26/2019    4:29 PM 06/21/2017    8:41 AM  PHQ 2/9 Scores  PHQ - 2 Score 0 0 0 0 0 0 0    Fall Risk    05/23/2022    2:29 PM 04/16/2022    9:00 AM 04/11/2021   10:10 AM 04/10/2021    9:04 AM 02/12/2020   11:14 AM  Fall Risk   Falls in the past year? 0 0 0 0 0  Number falls in past yr: 0  0    Injury with Fall? 0  0    Risk for fall due to : No Fall Risks  No Fall Risks    Follow up Falls prevention discussed  Falls prevention discussed      FALL RISK PREVENTION PERTAINING TO THE HOME:  Any stairs in or around the home? No  If so, are there any without handrails? No  Home free of loose throw rugs in walkways, pet beds, electrical cords, etc? Yes  Adequate lighting in your home to reduce risk of falls? Yes   ASSISTIVE DEVICES UTILIZED TO PREVENT FALLS:  Life alert? No  Use of a cane, walker or w/c? No  Grab bars in the bathroom? No  Shower chair or bench in shower? Yes  Elevated toilet seat or a handicapped toilet? No        05/23/2022    2:32 PM 04/11/2021   10:10 AM  6CIT Screen  What Year? 0 points 0 points  What month? 0 points 0 points  What time? 0 points 0 points  Count back from 20 0 points 0 points  Months in reverse 0 points 0 points  Repeat phrase 0 points 2 points  Total  Score 0 points 2 points    Immunizations Immunization History  Administered Date(s) Administered   Pneumococcal Conjugate-13 05/04/2013   Pneumococcal Polysaccharide-23 06/30/2010   Td 05/04/2013   Tdap 05/04/2013   Zoster, Live 12/31/2014    TDAP status: Up to date  Flu Vaccine status: Declined, Education has been provided regarding the importance of this vaccine but patient still declined. Advised may receive this vaccine at local pharmacy or Health Dept. Aware to provide a copy of the vaccination record if obtained from local pharmacy or Health Dept. Verbalized acceptance and understanding.  Pneumococcal vaccine status: Up to date  Covid-19 vaccine status: Completed vaccines  Qualifies for Shingles Vaccine? Yes   Zostavax completed No   Shingrix Completed?: No.    Education has been provided regarding the importance of this vaccine. Patient has been advised to call insurance company to determine out of pocket expense if they have not yet received this vaccine. Advised may also receive vaccine at local pharmacy or Health Dept. Verbalized acceptance and understanding.  Screening Tests Health Maintenance  Topic Date Due   COVID-19 Vaccine (1) Never done   INFLUENZA VACCINE  07/01/2022 (Originally 10/31/2021)   Zoster Vaccines- Shingrix (1 of 2) 07/16/2022 (Originally 04/04/1995)   DTaP/Tdap/Td (3 - Td or Tdap) 05/05/2023   Medicare Annual Wellness (AWV)  05/24/2023   Pneumonia Vaccine 61+ Years old  Completed   DEXA SCAN  Completed   Hepatitis C Screening  Completed   HPV VACCINES  Aged Out   COLONOSCOPY (Pts 45-51yr Insurance coverage will need to be confirmed)  Discontinued    Health Maintenance  Health Maintenance Due  Topic Date Due   COVID-19 Vaccine (1) Never done    Colorectal cancer screening: No longer required.   Mammogram status: No longer required due to age .  Bone Density status: Ordered declined at this time . Pt provided with contact info and advised to  call to schedule appt.  Lung Cancer Screening: (Low Dose CT Chest recommended if Age 77-80years, 30 pack-year currently smoking OR have quit w/in 15years.) does not qualify.   Lung Cancer Screening Referral: n/a  Additional Screening:  Hepatitis C Screening: does not qualify;   Vision Screening: Recommended annual ophthalmology exams for early detection of glaucoma and other disorders of the eye. Is the patient up to date with their annual eye exam?  Yes  Who is the provider or what is the name of the office in which the patient attends annual eye exams? Dr.Lee  If pt is not established with a provider, would they like to be referred to a provider to establish care? No .   Dental Screening: Recommended annual dental exams for proper oral hygiene  Community Resource Referral / Chronic Care Management: CRR required this visit?  No   CCM required this visit?  No      Plan:     I have personally reviewed and noted the following in the patient's chart:   Medical and social history Use of alcohol, tobacco or illicit drugs  Current medications and supplements including opioid prescriptions. Patient is not currently taking opioid prescriptions. Functional ability and status Nutritional status Physical activity Advanced directives List of other physicians Hospitalizations, surgeries, and ER visits in previous 12 months Vitals Screenings to include cognitive, depression, and falls Referrals and appointments  In addition, I have reviewed and discussed with patient certain preventive protocols, quality metrics, and best practice recommendations. A written personalized care plan for preventive services as well as general preventive health recommendations were provided to patient.     LDaphane Shepherd LPN   [id number]  Nurse Notes: Declined Dexa will discuss with PCP

## 2022-07-05 DIAGNOSIS — H5203 Hypermetropia, bilateral: Secondary | ICD-10-CM | POA: Diagnosis not present

## 2022-07-05 DIAGNOSIS — H524 Presbyopia: Secondary | ICD-10-CM | POA: Diagnosis not present

## 2022-07-05 DIAGNOSIS — H52229 Regular astigmatism, unspecified eye: Secondary | ICD-10-CM | POA: Diagnosis not present

## 2022-08-08 DIAGNOSIS — Z0100 Encounter for examination of eyes and vision without abnormal findings: Secondary | ICD-10-CM | POA: Diagnosis not present
